# Patient Record
Sex: Male | Born: 1935 | Race: White | Hispanic: No | Smoking: Former smoker
Health system: Southern US, Community
[De-identification: ages and names within clinical notes are randomized; demographics above are authoritative.]

## PROBLEM LIST (undated history)

## (undated) DIAGNOSIS — Z9889 Other specified postprocedural states: Secondary | ICD-10-CM

## (undated) DIAGNOSIS — I10 Essential (primary) hypertension: Secondary | ICD-10-CM

## (undated) DIAGNOSIS — E079 Disorder of thyroid, unspecified: Secondary | ICD-10-CM

## (undated) DIAGNOSIS — R7303 Prediabetes: Secondary | ICD-10-CM

## (undated) DIAGNOSIS — R112 Nausea with vomiting, unspecified: Secondary | ICD-10-CM

## (undated) HISTORY — DX: Disorder of thyroid, unspecified: E07.9

## (undated) HISTORY — DX: Prediabetes: R73.03

## (undated) HISTORY — PX: HERNIA REPAIR: SHX51

---

## 2005-09-13 ENCOUNTER — Ambulatory Visit: Payer: Self-pay | Admitting: Pulmonary Disease

## 2006-06-12 ENCOUNTER — Ambulatory Visit: Payer: Self-pay | Admitting: Family Medicine

## 2006-10-14 ENCOUNTER — Ambulatory Visit: Payer: Self-pay | Admitting: Family Medicine

## 2006-10-18 ENCOUNTER — Ambulatory Visit: Payer: Self-pay | Admitting: Family Medicine

## 2006-10-24 ENCOUNTER — Ambulatory Visit: Payer: Self-pay | Admitting: Family Medicine

## 2006-11-12 ENCOUNTER — Ambulatory Visit: Payer: Self-pay | Admitting: Family Medicine

## 2014-09-06 DIAGNOSIS — E038 Other specified hypothyroidism: Secondary | ICD-10-CM | POA: Insufficient documentation

## 2015-02-22 ENCOUNTER — Telehealth: Payer: Self-pay | Admitting: Family Medicine

## 2015-02-22 NOTE — Telephone Encounter (Signed)
Pt given new pt appt with Dr.Miller 8/15 at 10:15. Pt is aware to arrive 30 minutes prior with a copy of his insurance card and valid photo ID. Pt will also come sign a records release to have records faxed over. Pt aware we don't do chronic pain management and he will bring current medication list with him.

## 2015-04-04 ENCOUNTER — Encounter (INDEPENDENT_AMBULATORY_CARE_PROVIDER_SITE_OTHER): Payer: Self-pay

## 2015-04-04 ENCOUNTER — Ambulatory Visit (INDEPENDENT_AMBULATORY_CARE_PROVIDER_SITE_OTHER): Payer: Medicare Other | Admitting: Family Medicine

## 2015-04-04 ENCOUNTER — Encounter: Payer: Self-pay | Admitting: Family Medicine

## 2015-04-04 VITALS — BP 148/80 | HR 60 | Temp 97.6°F | Ht 70.0 in | Wt 153.0 lb

## 2015-04-04 DIAGNOSIS — R5383 Other fatigue: Secondary | ICD-10-CM | POA: Diagnosis not present

## 2015-04-04 DIAGNOSIS — R5381 Other malaise: Secondary | ICD-10-CM | POA: Diagnosis not present

## 2015-04-04 DIAGNOSIS — R7303 Prediabetes: Secondary | ICD-10-CM | POA: Insufficient documentation

## 2015-04-04 NOTE — Progress Notes (Signed)
   Subjective:    Patient ID: Miguel Hill, male    DOB: 08-Oct-1932, 81 y.o.   MRN: [id number]  HPI 81 year old gentleman who is here to get established. He has been seeing a different doctor in town. I reviewed his notes from previous doctor. He has had some malaise and fatigue and been through lots of tests including thyroid testosterone levels Lyme disease as well as the usual metabolic chemistries. It looks like he was hypothyroid but patient is unwilling to take medicines because they "make him sleepy". Even vitamins are said to make him sleepy. We spent a fair amount of time discussing malaise and fatigue. He ask that he have some sort of tumor since vitamins and other prescription medicines make him sleepy. I explained that I think this would be unlikely causative symptom in this regard and discouraged lots more testing. Typically he has stopped medicines like thyroid hormone after just a few days and I think it might be important to take the medicine longer as his body adjusts. He also had an elevated PSA that was repeated after a course of anabiotic and by his history normalized. He is fairly well red but I think much of his information has been obtained through the inner net which is not very consistent.  Patient Active Problem List   Diagnosis Date Noted  . Prediabetes    No outpatient encounter prescriptions on file as of 04/04/2015.   No facility-administered encounter medications on file as of 04/04/2015.       Review of Systems  Constitutional: Positive for fatigue. Negative for unexpected weight change.  HENT: Negative.   Respiratory: Negative.   Cardiovascular: Negative.   Gastrointestinal: Negative.   Genitourinary: Negative.   Neurological: Negative.   Psychiatric/Behavioral: Negative.        Objective:   Physical Exam  Constitutional: He is oriented to person, place, and time. He appears well-developed and well-nourished.  HENT:  Head: Normocephalic.  Eyes:  Pupils are equal, round, and reactive to light.  Cardiovascular: Normal rate and regular rhythm.   Pulmonary/Chest: Effort normal and breath sounds normal.  Musculoskeletal: Normal range of motion.  Neurological: He is alert and oriented to person, place, and time.  Psychiatric: He has a normal mood and affect. His behavior is normal.     BP 148/80 mmHg  Pulse 60  Temp(Src) 97.6 F (36.4 C) (Oral)  Ht $R'5\' 10"'Vu$  (1.778 m)  Wt 153 lb (69.4 kg)  BMI 21.95 kg/m2      Assessment & Plan:  1. Malaise and fatigue I suspect many of his symptoms are related to age. I think it may be challenging to get him to take any medicines including thyroid supplement but we'll check some basic labs and go from there.  Wardell Honour MD - BMP8+EGFR - Thyroid Panel With TSH - CBC with Differential/Platelet

## 2015-04-05 ENCOUNTER — Other Ambulatory Visit: Payer: Self-pay | Admitting: *Deleted

## 2015-04-05 DIAGNOSIS — E039 Hypothyroidism, unspecified: Secondary | ICD-10-CM

## 2015-04-05 LAB — BMP8+EGFR
BUN/Creatinine Ratio: 19 (ref 10–22)
BUN: 17 mg/dL (ref 8–27)
CO2: 22 mmol/L (ref 18–29)
CREATININE: 0.89 mg/dL (ref 0.76–1.27)
Calcium: 9.6 mg/dL (ref 8.6–10.2)
Chloride: 103 mmol/L (ref 97–108)
GFR calc Af Amer: 94 mL/min/{1.73_m2} (ref >59)
GFR calc non Af Amer: 81 mL/min/{1.73_m2} (ref >59)
GLUCOSE: 95 mg/dL (ref 65–99)
Potassium: 4.5 mmol/L (ref 3.5–5.2)
Sodium: 140 mmol/L (ref 134–144)

## 2015-04-05 LAB — CBC WITH DIFFERENTIAL/PLATELET
BASOS ABS: 0 10*3/uL (ref 0.0–0.2)
Basos: 0 %
EOS (ABSOLUTE): 0.1 10*3/uL (ref 0.0–0.4)
Eos: 1 %
Hematocrit: 43.6 % (ref 37.5–51.0)
Hemoglobin: 14.8 g/dL (ref 12.6–17.7)
Immature Grans (Abs): 0 10*3/uL (ref 0.0–0.1)
Immature Granulocytes: 0 %
LYMPHS ABS: 1.7 10*3/uL (ref 0.7–3.1)
Lymphs: 29 %
MCH: 30.3 pg (ref 26.6–33.0)
MCHC: 33.9 g/dL (ref 31.5–35.7)
MCV: 89 fL (ref 79–97)
MONOS ABS: 0.6 10*3/uL (ref 0.1–0.9)
Monocytes: 10 %
Neutrophils Absolute: 3.4 10*3/uL (ref 1.4–7.0)
Neutrophils: 60 %
PLATELETS: 163 10*3/uL (ref 150–379)
RBC: 4.88 x10E6/uL (ref 4.14–5.80)
RDW: 13.4 % (ref 12.3–15.4)
WBC: 5.9 10*3/uL (ref 3.4–10.8)

## 2015-04-05 LAB — THYROID PANEL WITH TSH
Free Thyroxine Index: 2.1 (ref 1.2–4.9)
T3 Uptake Ratio: 26 % (ref 24–39)
T4 TOTAL: 8.1 ug/dL (ref 4.5–12.0)
TSH: 8.25 u[IU]/mL — AB (ref 0.450–4.500)

## 2015-04-28 ENCOUNTER — Telehealth: Payer: Self-pay | Admitting: Family Medicine

## 2015-04-28 DIAGNOSIS — G479 Sleep disorder, unspecified: Secondary | ICD-10-CM

## 2015-05-12 NOTE — Telephone Encounter (Signed)
Okay for sleep study

## 2015-05-13 NOTE — Telephone Encounter (Signed)
Left message for patient to call back regarding sleep study.  Patient will need to see a Neurologist and the neurologist will have to make referral.  Referral for neurology made.

## 2015-05-16 ENCOUNTER — Telehealth: Payer: Self-pay | Admitting: Family Medicine

## 2015-05-20 NOTE — Telephone Encounter (Signed)
Patient has not heard anything about his neurology appointment.  He prefers to go to [place] or [facility] .  Please check  On referral [phone].

## 2015-05-24 NOTE — Telephone Encounter (Signed)
Spoke with pt; We will try to find a company that does home sleep studies. He has had this done in the past

## 2015-05-30 ENCOUNTER — Telehealth: Payer: Self-pay | Admitting: Family Medicine

## 2015-05-30 NOTE — Telephone Encounter (Signed)
Pt aware that we are waiting on [employer] to call us about home study for CPAP

## 2015-06-01 ENCOUNTER — Telehealth: Payer: Self-pay | Admitting: Internal Medicine

## 2015-06-01 NOTE — Telephone Encounter (Signed)
Spoke with pt, requesting a home sleep test instead of a lab test d/t living far away from sleep lab.  I advised pt that CY does not return to office until early next week, and we would relay CY's recs to pt when he returns next week.  Pt expressed understanding.  CY please advise if you're ok with pt having a home sleep test instead of a lab sleep test.  Thanks!

## 2015-06-08 NOTE — Telephone Encounter (Signed)
Pt returned call. I informed him of recs and he refused to make ov with our office and stated that he would speak to the referring MD about ordering a sleep study. Pt voiced understanding and had no further questions. Nothing further needed, will sign off on message.

## 2015-06-08 NOTE — Telephone Encounter (Signed)
LMTCB

## 2015-06-08 NOTE — Telephone Encounter (Signed)
Pt checking on sched a HST, please cb at previous number listed

## 2015-06-08 NOTE — Telephone Encounter (Signed)
Spoke with pt, advised that we were still waiting to hear from CY if he's ok with a hst instead of a lab sleep test.    CY please advise.  Thanks!

## 2015-06-08 NOTE — Telephone Encounter (Signed)
We are not able to order sleep study on a patient we have never seen; pt can speak with referring MD to see if they will set up sleep study. Pt will need to be followed if CPAP/BiPAP will be used after study.   Pt can set up consult with a sleep MD here in the office or discuss with his referring MD-we understand he does not want to drive 10-31 miles to our office. Thanks.

## 2015-06-09 ENCOUNTER — Telehealth: Payer: Self-pay | Admitting: Family Medicine

## 2015-06-14 ENCOUNTER — Telehealth: Payer: Self-pay | Admitting: Family Medicine

## 2015-06-22 ENCOUNTER — Telehealth: Payer: Self-pay | Admitting: Family Medicine

## 2015-06-23 ENCOUNTER — Telehealth: Payer: Self-pay | Admitting: Family Medicine

## 2015-06-24 NOTE — Telephone Encounter (Signed)
Spoke with pt. Will call him back on Monday with more information

## 2015-06-28 NOTE — Telephone Encounter (Signed)
Pt aware of  In-home sleep study status

## 2015-07-01 NOTE — Telephone Encounter (Signed)
Pt aware of status referral

## 2015-08-17 ENCOUNTER — Telehealth: Payer: Self-pay | Admitting: Family Medicine

## 2015-08-17 NOTE — Telephone Encounter (Signed)
Miguel Hill is handling this right now

## 2015-08-17 NOTE — Telephone Encounter (Signed)
Please advise 

## 2015-09-21 ENCOUNTER — Telehealth: Payer: Self-pay | Admitting: Family Medicine

## 2015-09-22 ENCOUNTER — Encounter: Payer: Self-pay | Admitting: Family Medicine

## 2015-09-22 ENCOUNTER — Ambulatory Visit (INDEPENDENT_AMBULATORY_CARE_PROVIDER_SITE_OTHER): Payer: Medicare Other | Admitting: Family Medicine

## 2015-09-22 VITALS — BP 154/83 | HR 59 | Temp 96.7°F | Ht 70.0 in | Wt 154.0 lb

## 2015-09-22 DIAGNOSIS — G479 Sleep disorder, unspecified: Secondary | ICD-10-CM | POA: Diagnosis not present

## 2015-09-22 DIAGNOSIS — R5383 Other fatigue: Secondary | ICD-10-CM | POA: Diagnosis not present

## 2015-09-22 NOTE — Telephone Encounter (Signed)
Pt scheduled to have a face to face with Dr.Miller 09/22/15 at 10:30

## 2015-09-22 NOTE — Progress Notes (Signed)
   Subjective:    Patient ID: Miguel Hill, male    DOB: 1932-08-29, 82 y.o.   MRN: [id number]  HPI Patient here today to discuss possible sleep apnea and fatigue.  At his last visit patient remarked that all pills make him sleepy including thyroid nerves and anything. Due to some of his reading he has thought that he might have sleep apnea and that would possibly make sense that the pills are not making him sleepy as he had thought that he has daytime sleepiness because of possible apnea. He comes today to schedule a visit for home sleep study.      Patient Active Problem List   Diagnosis Date Noted  . Prediabetes    No outpatient encounter prescriptions on file as of 09/22/2015.   No facility-administered encounter medications on file as of 09/22/2015.      Review of Systems  Constitutional: Positive for fatigue.  HENT: Negative.   Eyes: Negative.   Respiratory: Negative.   Cardiovascular: Negative.   Gastrointestinal: Negative.   Endocrine: Negative.   Genitourinary: Negative.   Musculoskeletal: Negative.   Skin: Negative.   Allergic/Immunologic: Negative.   Neurological: Negative.   Hematological: Negative.   Psychiatric/Behavioral: Negative.        Objective:   Physical Exam  Constitutional: He is oriented to person, place, and time. He appears well-developed and well-nourished.  Cardiovascular: Normal rate, regular rhythm and normal heart sounds.   Pulmonary/Chest: Effort normal and breath sounds normal.  Neurological: He is alert and oriented to person, place, and time.  Psychiatric: He has a normal mood and affect. His behavior is normal.    BP 154/83 mmHg  Pulse 59  Temp(Src) 96.7 F (35.9 C) (Oral)  Ht 5\' 10"  (1.778 m)  Wt 154 lb (69.854 kg)  BMI 22.10 kg/m2       Assessment & Plan:   1. Fatigue due to sleep pattern disturbance T due to sleep apnea is working.. Will order a sleep study to document reality. Discussed possibility of wearing CPAP at  night and he is willing to do that if we can document the problem  Wardell Honour MD

## 2015-09-22 NOTE — Telephone Encounter (Signed)
x

## 2015-09-29 ENCOUNTER — Telehealth: Payer: Self-pay | Admitting: Family Medicine

## 2015-09-29 NOTE — Telephone Encounter (Signed)
Did we make this referral?  If he had the study, are results available

## 2015-09-29 NOTE — Telephone Encounter (Signed)
Please address

## 2015-09-30 ENCOUNTER — Encounter: Payer: Self-pay | Admitting: Family Medicine

## 2015-10-26 ENCOUNTER — Telehealth: Payer: Self-pay | Admitting: Family Medicine

## 2015-10-26 NOTE — Telephone Encounter (Signed)
I do not see results of sleep study in Epic. Maybe we could call for the results.

## 2015-10-26 NOTE — Telephone Encounter (Signed)
Patient says [facility] [phone]), has sent [facility] results of his tests.  We will look for a faxed report from them.  If unable to locate, will call for a result report.

## 2015-10-27 NOTE — Telephone Encounter (Signed)
Pt aware we do not file insurance for any home studies and he will need to contact the company that performed the study

## 2015-11-04 ENCOUNTER — Encounter: Payer: Self-pay | Admitting: Family Medicine

## 2015-11-04 ENCOUNTER — Ambulatory Visit (INDEPENDENT_AMBULATORY_CARE_PROVIDER_SITE_OTHER): Payer: Medicare Other

## 2015-11-04 ENCOUNTER — Ambulatory Visit (INDEPENDENT_AMBULATORY_CARE_PROVIDER_SITE_OTHER): Payer: Medicare Other | Admitting: Family Medicine

## 2015-11-04 VITALS — BP 185/89 | HR 57 | Temp 96.7°F | Ht 70.0 in | Wt 153.0 lb

## 2015-11-04 DIAGNOSIS — M25561 Pain in right knee: Secondary | ICD-10-CM

## 2015-11-04 MED ORDER — CLORAZEPATE DIPOTASSIUM 3.75 MG PO TABS: 3.7500 mg | ORAL_TABLET | Freq: Two times a day (BID) | ORAL | Status: DC | PRN

## 2015-11-04 MED ORDER — MELOXICAM 7.5 MG PO TABS: 7.5000 mg | ORAL_TABLET | Freq: Every day | ORAL | Status: DC

## 2015-11-04 NOTE — Progress Notes (Signed)
   Subjective:    Patient ID: Miguel Hill, male    DOB: [birth year], 82 y.o.   MRN: [id number]  HPI Patient here today for right knee pain that started about 2-3 weeks ago.There is no known injury. No sensation of giving way or locking. There are no unusual sounds coming from the knee with stair climbing or walking. There is been no swelling except maybe some swelling posteriorly.     Patient Active Problem List   Diagnosis Date Noted  . Fatigue due to sleep pattern disturbance 09/22/2015  . Prediabetes    No outpatient encounter prescriptions on file as of 11/04/2015.   No facility-administered encounter medications on file as of 11/04/2015.      Review of Systems  Constitutional: Negative.   HENT: Negative.   Eyes: Negative.   Respiratory: Negative.   Cardiovascular: Negative.   Gastrointestinal: Negative.   Endocrine: Negative.   Genitourinary: Negative.   Musculoskeletal: Positive for arthralgias (right knee pain).  Skin: Negative.   Allergic/Immunologic: Negative.   Neurological: Negative.   Hematological: Negative.   Psychiatric/Behavioral: Negative.        Objective:   Physical Exam  Constitutional: He appears well-developed and well-nourished.  Musculoskeletal:  Right knee: There is no effusion. There is no tenderness with manipulation of the patella. Joint lines are nontender and the knee is stable to stress testing and drawer sign is negative. There is some fullness posteriorly especially compared to left knee. X-ray shows some narrowing of the joint space medially on both knees   BP 185/89 mmHg  Pulse 57  Temp(Src) 96.7 F (35.9 C) (Oral)  Ht 5\' 10"  (1.778 m)  Wt 153 lb (69.4 kg)  BMI 21.95 kg/m2        Assessment & Plan:  1. Right knee pain I believe knee pain is coming from some osteoarthritis degenerative joint disease. There is at least a suggestion of Baker's cyst. Will try meloxicam 7.5 mg. Patient is also using some DMSO liniment. Wardell Honour MD - DG Knee 1-2 Views Right; Future

## 2015-11-14 ENCOUNTER — Telehealth: Payer: Self-pay | Admitting: Family Medicine

## 2015-11-14 NOTE — Telephone Encounter (Signed)
Patient given an appointment with Sabra Heck.

## 2015-11-17 ENCOUNTER — Ambulatory Visit (INDEPENDENT_AMBULATORY_CARE_PROVIDER_SITE_OTHER): Payer: Medicare Other | Admitting: Family Medicine

## 2015-11-17 ENCOUNTER — Encounter: Payer: Self-pay | Admitting: Family Medicine

## 2015-11-17 VITALS — BP 161/81 | HR 63 | Temp 96.8°F | Ht 70.0 in | Wt 158.6 lb

## 2015-11-17 DIAGNOSIS — G478 Other sleep disorders: Secondary | ICD-10-CM

## 2015-11-17 DIAGNOSIS — M25561 Pain in right knee: Secondary | ICD-10-CM | POA: Diagnosis not present

## 2015-11-17 DIAGNOSIS — G4730 Sleep apnea, unspecified: Secondary | ICD-10-CM

## 2015-11-17 NOTE — Progress Notes (Signed)
   Subjective:    Patient ID: Miguel Hill, male    DOB: 09-22-1932, 82 y.o.   MRN: [id number]  HPI patient returns today with persistent pain in his right knee. He could not take meloxicam as it made him sleepy, as does everything. He still uses DMSO. He actually seemed a little undecided about injection of his knee but eventually decided he would try it. We also spent time talking about his home sleep apnea study which showed basically that he does not have obstructive sleep apnea. He had some desaturation and wants to pursue overnight pulse oximetry to see if he would qualify for home oxygen. I'm not really sure that this would benefit him. His symptoms are tiredness in the daytime but is not because of apnea.  Patient Active Problem List   Diagnosis Date Noted  . Fatigue due to sleep pattern disturbance 09/22/2015  . Prediabetes    Outpatient Encounter Prescriptions as of 11/17/2015  Medication Sig  . [DISCONTINUED] clorazepate (TRANXENE-T) 3.75 MG tablet Take 1 tablet (3.75 mg total) by mouth 2 (two) times daily as needed for anxiety.  . [DISCONTINUED] meloxicam (MOBIC) 7.5 MG tablet Take 1 tablet (7.5 mg total) by mouth daily.   No facility-administered encounter medications on file as of 11/17/2015.      Review of Systems  Constitutional: Positive for fatigue.  Respiratory: Negative.   Cardiovascular: Negative.   Neurological: Negative.   Psychiatric/Behavioral: Negative.        Objective:   Physical Exam  Constitutional: He appears well-developed and well-nourished.  Cardiovascular: Normal rate and regular rhythm.   Musculoskeletal:  Right knee easily injected with Depo-Medrol and Marcaine.          Assessment & Plan:

## 2015-11-21 ENCOUNTER — Telehealth: Payer: Self-pay

## 2015-11-21 DIAGNOSIS — R0902 Hypoxemia: Secondary | ICD-10-CM

## 2015-11-21 NOTE — Telephone Encounter (Signed)
Patient calling about the Pulse oximetry that you were suppose to order on him   Dont see anything   Dr Sabra Heck

## 2015-11-22 NOTE — Telephone Encounter (Signed)
Had intended to order overnight pulse oximetry to see if he would benefit from nighttime oxygen

## 2015-12-05 ENCOUNTER — Telehealth: Payer: Self-pay | Admitting: Family Medicine

## 2015-12-05 NOTE — Telephone Encounter (Signed)
Please address

## 2015-12-06 NOTE — Telephone Encounter (Signed)
Pt aware  - via emial - we did hear from [facility] and she will contact pt

## 2015-12-06 NOTE — Telephone Encounter (Signed)
I do not know how to put this order in   Can you help?

## 2015-12-06 NOTE — Telephone Encounter (Signed)
Again, I thought we had ordered this overnight pulse oximetry; is tthere anybody who can do that?  thank you

## 2015-12-14 ENCOUNTER — Encounter: Payer: Self-pay | Admitting: Family Medicine

## 2015-12-21 ENCOUNTER — Telehealth: Payer: Self-pay | Admitting: Family Medicine

## 2015-12-21 NOTE — Telephone Encounter (Signed)
Test printed for provider to review and patient will be contacted afterwards with results.

## 2015-12-21 NOTE — Telephone Encounter (Signed)
Aware of oximetry being all right with no need for supplemental oxygen. He says he may purchase a C-pap machine to see if it helps him have more energy during the day. He plans to follow up with Dr. Sabra Heck to be evaluated for his poor sleep and lack of energy.

## 2015-12-21 NOTE — Telephone Encounter (Signed)
Patient's result shows one episode of decreased oxygen at 89% but does not qualify him for any oxygen, per Dr. Sabra Heck.

## 2015-12-23 NOTE — Telephone Encounter (Signed)
Patient aware , he does not qualify for oxygen.

## 2015-12-29 ENCOUNTER — Telehealth: Payer: Self-pay | Admitting: *Deleted

## 2015-12-29 NOTE — Telephone Encounter (Signed)
-----   Message from Wardell Honour, MD sent at 12/29/2015  7:56 AM EDT ----- Overnight pulse oximetry felt to demonstrate need for continuous oxygen

## 2016-01-19 ENCOUNTER — Other Ambulatory Visit: Payer: Self-pay | Admitting: Family Medicine

## 2016-01-20 NOTE — Telephone Encounter (Signed)
Spoke with patient, he went yesterday and got a new meter and strips to go along with it.

## 2016-02-24 ENCOUNTER — Ambulatory Visit (INDEPENDENT_AMBULATORY_CARE_PROVIDER_SITE_OTHER): Payer: Medicare Other | Admitting: Family Medicine

## 2016-02-24 ENCOUNTER — Encounter: Payer: Self-pay | Admitting: Family Medicine

## 2016-02-24 VITALS — BP 158/85 | HR 59 | Temp 97.1°F | Ht 70.0 in | Wt 152.0 lb

## 2016-02-24 DIAGNOSIS — R5383 Other fatigue: Secondary | ICD-10-CM

## 2016-02-24 DIAGNOSIS — Z0000 Encounter for general adult medical examination without abnormal findings: Secondary | ICD-10-CM

## 2016-02-24 NOTE — Patient Instructions (Signed)
Medicare Annual Wellness Visit  [place] and the medical providers at [facility] strive to bring you the best medical care.  In doing so we not only want to address your current medical conditions and concerns but also to detect new conditions early and prevent illness, disease and health-related problems.    Medicare offers a yearly Wellness Visit which allows our clinical staff to assess your need for preventative services including immunizations, lifestyle education, counseling to decrease risk of preventable diseases and screening for fall risk and other medical concerns.    This visit is provided free of charge (no copay) for all Medicare recipients. The clinical pharmacists at [facility] have begun to conduct these Wellness Visits which will also include a thorough review of all your medications.    As you primary medical provider recommend that you make an appointment for your Annual Wellness Visit if you have not done so already this year.  You may set up this appointment before you leave today or you may call back ([phone]) and schedule an appointment.  Please make sure when you call that you mention that you are scheduling your Annual Wellness Visit with the clinical pharmacist so that the appointment may be made for the proper length of time.     Continue current medications. Continue good therapeutic lifestyle changes which include good diet and exercise. Fall precautions discussed with patient. If an FOBT was given today- please return it to our front desk. If you are over 50 years old - you may need Prevnar 13 or the adult Pneumonia vaccine.  After your visit with us today you will receive a survey in the mail or online from [employer] regarding your care with us. Please take a moment to fill this out. Your feedback is very important to us as you can help us better understand your patient needs as well as improve  your experience and satisfaction. WE CARE ABOUT YOU!!!    

## 2016-02-24 NOTE — Progress Notes (Signed)
Subjective:    Patient ID: Miguel Hill, male    DOB: [birth year], 82 y.o.   MRN: [id number]  HPI Patient is here today for annual wellness exam and follow up of chronic medical problems. He is not currently taking any medications.  Patient is here for physical his main complaint is chronic and that is malaise and fatigue. He is a avid reader of the Internet and presents a list of tests and blood work he would like to have to rule out why he might be tired. He is hypothyroid but every peel even vitamins or supplements that he takes, he contends makes him sleepy. I have never encountered a situation like this. Basically he is on no medicines because he cannot take anything. Appetite is good. We had some sleep studies done to try to explain the tiredness that everything really checked out well there    Patient Active Problem List   Diagnosis Date Noted  . Fatigue due to sleep pattern disturbance 09/22/2015  . Prediabetes    No outpatient encounter prescriptions on file as of 02/24/2016.   No facility-administered encounter medications on file as of 02/24/2016.      Review of Systems  Constitutional: Positive for fatigue (decresed enery lelvel).  HENT: Negative.   Eyes: Negative.   Respiratory: Negative.   Cardiovascular: Negative.   Gastrointestinal: Negative.   Endocrine: Negative.   Genitourinary: Negative.   Musculoskeletal: Negative.   Skin: Negative.   Allergic/Immunologic: Negative.   Neurological: Negative.   Hematological: Negative.   Psychiatric/Behavioral: Negative.        Objective:   Physical Exam  Constitutional: He is oriented to person, place, and time. He appears well-developed and well-nourished.  HENT:  Head: Normocephalic.  Right Ear: External ear normal.  Left Ear: External ear normal.  Nose: Nose normal.  Mouth/Throat: Oropharynx is clear and moist.  Eyes: Conjunctivae and EOM are normal. Pupils are equal, round, and reactive to light.  Neck:  Normal range of motion. Neck supple.  Cardiovascular: Normal rate, regular rhythm, normal heart sounds and intact distal pulses.   Pulmonary/Chest: Effort normal and breath sounds normal.  Abdominal: Soft. Bowel sounds are normal.  Musculoskeletal: Normal range of motion.  Neurological: He is alert and oriented to person, place, and time.  Skin: Skin is warm and dry.  Psychiatric: He has a normal mood and affect. His behavior is normal. Judgment and thought content normal.   BP 158/85 mmHg  Pulse 59  Temp(Src) 97.1 F (36.2 C) (Oral)  Ht '5\' 10"'$  (1.778 m)  Wt 152 lb (68.947 kg)  BMI 21.81 kg/m2        Assessment & Plan:  1. Annual physical exam Exam is normal. I suspect his malaise and fatigue has a depressed basis but he is unwilling to take any antidepressants because they make him sleepy. - CMP14+EGFR - Lipid panel - Thyroid Panel With TSH - Cortisol-am, blood - Lyme Ab/Western Blot Reflex - Testosterone,Free and Total - C-reactive protein - Vitamin B12 - VITAMIN D 25 Hydroxy (Vit-D Deficiency, Fractures) - Magnesium  2. Other fatigue Patient requests many of the test and I'm willing to do what is practical to try and help but I'm not optimistic that any of the tests will be abnormal except for the TSH. - Thyroid Panel With TSH - Cortisol-am, blood - Lyme Ab/Western Blot Reflex - Testosterone,Free and Total - C-reactive protein - Vitamin B12 - VITAMIN D 25 Hydroxy (Vit-D Deficiency, Fractures) - Magnesium  Lillette Boxer  Sabra Heck MD

## 2016-02-27 LAB — CMP14+EGFR
A/G RATIO: 1.6 (ref 1.2–2.2)
ALBUMIN: 4.1 g/dL (ref 3.5–4.7)
ALT: 15 IU/L (ref 0–44)
AST: 21 IU/L (ref 0–40)
Alkaline Phosphatase: 72 IU/L (ref 39–117)
BUN/Creatinine Ratio: 17 (ref 10–24)
BUN: 14 mg/dL (ref 8–27)
Bilirubin Total: 0.3 mg/dL (ref 0.0–1.2)
CALCIUM: 9.1 mg/dL (ref 8.6–10.2)
CO2: 24 mmol/L (ref 18–29)
CREATININE: 0.83 mg/dL (ref 0.76–1.27)
Chloride: 104 mmol/L (ref 96–106)
GFR, EST AFRICAN AMERICAN: 96 mL/min/{1.73_m2} (ref >59)
GFR, EST NON AFRICAN AMERICAN: 83 mL/min/{1.73_m2} (ref >59)
GLOBULIN, TOTAL: 2.5 g/dL (ref 1.5–4.5)
Glucose: 97 mg/dL (ref 65–99)
POTASSIUM: 3.9 mmol/L (ref 3.5–5.2)
SODIUM: 143 mmol/L (ref 134–144)
TOTAL PROTEIN: 6.6 g/dL (ref 6.0–8.5)

## 2016-02-27 LAB — LIPID PANEL
CHOL/HDL RATIO: 3.1 ratio (ref 0.0–5.0)
Cholesterol, Total: 179 mg/dL (ref 100–199)
HDL: 58 mg/dL (ref >39)
LDL CALC: 105 mg/dL — AB (ref 0–99)
TRIGLYCERIDES: 80 mg/dL (ref 0–149)
VLDL Cholesterol Cal: 16 mg/dL (ref 5–40)

## 2016-02-27 LAB — TESTOSTERONE,FREE AND TOTAL
Testosterone, Free: 4.2 pg/mL — ABNORMAL LOW (ref 6.6–18.1)
Testosterone: 537 ng/dL (ref 348–1197)

## 2016-02-27 LAB — LYME AB/WESTERN BLOT REFLEX
LYME DISEASE AB, QUANT, IGM: <0.8 index (ref 0.00–0.79)
Lyme IgG/IgM Ab: <0.91 {ISR} (ref 0.00–0.90)

## 2016-02-27 LAB — THYROID PANEL WITH TSH
Free Thyroxine Index: 1.8 (ref 1.2–4.9)
T3 Uptake Ratio: 25 % (ref 24–39)
T4 TOTAL: 7 ug/dL (ref 4.5–12.0)
TSH: 2.65 u[IU]/mL (ref 0.450–4.500)

## 2016-02-27 LAB — MAGNESIUM: Magnesium: 2.1 mg/dL (ref 1.6–2.3)

## 2016-02-27 LAB — C-REACTIVE PROTEIN: CRP: 1 mg/L (ref 0.0–4.9)

## 2016-02-27 LAB — VITAMIN D 25 HYDROXY (VIT D DEFICIENCY, FRACTURES): VIT D 25 HYDROXY: 24.2 ng/mL — AB (ref 30.0–100.0)

## 2016-02-27 LAB — CORTISOL-AM, BLOOD: CORTISOL - AM: 10.4 ug/dL (ref 6.2–19.4)

## 2016-02-27 LAB — VITAMIN B12: VITAMIN B 12: 329 pg/mL (ref 211–946)

## 2016-03-05 ENCOUNTER — Telehealth: Payer: Self-pay | Admitting: *Deleted

## 2016-03-05 NOTE — Telephone Encounter (Signed)
Pt said he never got labs that were mailed to him?

## 2016-03-05 NOTE — Telephone Encounter (Signed)
Labs have been re sent

## 2016-03-07 ENCOUNTER — Telehealth: Payer: Self-pay | Admitting: Family Medicine

## 2016-03-12 ENCOUNTER — Other Ambulatory Visit: Payer: Self-pay | Admitting: *Deleted

## 2016-03-12 ENCOUNTER — Other Ambulatory Visit: Payer: Medicare Other

## 2016-03-12 DIAGNOSIS — Z0000 Encounter for general adult medical examination without abnormal findings: Secondary | ICD-10-CM

## 2016-03-13 LAB — FECAL OCCULT BLOOD, IMMUNOCHEMICAL: Fecal Occult Bld: NEGATIVE

## 2016-03-13 NOTE — Telephone Encounter (Signed)
PT labs were mailed by Nigel Berthold according to note in labs.

## 2016-03-16 ENCOUNTER — Telehealth: Payer: Self-pay | Admitting: Family Medicine

## 2016-03-16 DIAGNOSIS — R5383 Other fatigue: Secondary | ICD-10-CM

## 2016-03-16 NOTE — Telephone Encounter (Signed)
Patient is requesting a cbc and ferritin check. Please advise. Patient aware that you will be out of the office until Thursday Aug 3.

## 2016-03-22 ENCOUNTER — Other Ambulatory Visit: Payer: Medicare Other

## 2016-03-22 DIAGNOSIS — E039 Hypothyroidism, unspecified: Secondary | ICD-10-CM

## 2016-03-22 DIAGNOSIS — R5383 Other fatigue: Secondary | ICD-10-CM

## 2016-03-22 NOTE — Telephone Encounter (Signed)
Patient aware that orders have been placed.

## 2016-03-22 NOTE — Telephone Encounter (Signed)
I have no problem doing CBC but DHEA is not necessary given other labs already completed

## 2016-03-24 LAB — CBC WITH DIFFERENTIAL/PLATELET

## 2016-03-24 LAB — THYROID PANEL WITH TSH
Free Thyroxine Index: 1.7 (ref 1.2–4.9)
T3 Uptake Ratio: 24 % (ref 24–39)
T4 TOTAL: 6.9 ug/dL (ref 4.5–12.0)
TSH: 2.95 u[IU]/mL (ref 0.450–4.500)

## 2016-03-24 LAB — FERRITIN: FERRITIN: 42 ng/mL (ref 30–400)

## 2016-03-24 LAB — DHEA: Dehydroepiandrosterone: 55 ng/dL (ref 31–701)

## 2016-03-30 ENCOUNTER — Telehealth: Payer: Self-pay | Admitting: Family Medicine

## 2016-03-30 DIAGNOSIS — R5383 Other fatigue: Secondary | ICD-10-CM

## 2016-03-30 NOTE — Telephone Encounter (Signed)
Pt notified of results Verbalizes understanding Cbc not done, cancelled by [facility] Order entered in Epic  Pt will come back in for lab

## 2016-04-06 ENCOUNTER — Other Ambulatory Visit: Payer: Medicare Other

## 2016-04-06 DIAGNOSIS — R5383 Other fatigue: Secondary | ICD-10-CM

## 2016-04-06 LAB — CBC WITH DIFFERENTIAL/PLATELET
BASOS ABS: 0 10*3/uL (ref 0.0–0.2)
Basos: 0 %
EOS (ABSOLUTE): 0.1 10*3/uL (ref 0.0–0.4)
EOS: 2 %
HEMATOCRIT: 42.8 % (ref 37.5–51.0)
HEMOGLOBIN: 14.9 g/dL (ref 12.6–17.7)
IMMATURE GRANS (ABS): 0 10*3/uL (ref 0.0–0.1)
IMMATURE GRANULOCYTES: 0 %
LYMPHS: 30 %
Lymphocytes Absolute: 1.6 10*3/uL (ref 0.7–3.1)
MCH: 30.8 pg (ref 26.6–33.0)
MCHC: 34.8 g/dL (ref 31.5–35.7)
MCV: 88 fL (ref 79–97)
MONOCYTES: 11 %
Monocytes Absolute: 0.6 10*3/uL (ref 0.1–0.9)
NEUTROS PCT: 57 %
Neutrophils Absolute: 3 10*3/uL (ref 1.4–7.0)
Platelets: 152 10*3/uL (ref 150–379)
RBC: 4.84 x10E6/uL (ref 4.14–5.80)
RDW: 12.8 % (ref 12.3–15.4)
WBC: 5.2 10*3/uL (ref 3.4–10.8)

## 2016-09-14 ENCOUNTER — Encounter: Payer: Self-pay | Admitting: Family

## 2016-09-14 ENCOUNTER — Ambulatory Visit (INDEPENDENT_AMBULATORY_CARE_PROVIDER_SITE_OTHER): Payer: Medicare Other | Admitting: Family

## 2016-09-14 VITALS — BP 158/80 | HR 57 | Temp 96.8°F | Ht 70.0 in | Wt 153.2 lb

## 2016-09-14 DIAGNOSIS — J209 Acute bronchitis, unspecified: Secondary | ICD-10-CM | POA: Diagnosis not present

## 2016-09-14 MED ORDER — DOXYCYCLINE HYCLATE 100 MG PO TABS: 100.0000 mg | ORAL_TABLET | Freq: Two times a day (BID) | ORAL | 0 refills | Status: DC

## 2016-09-14 MED ORDER — BENZONATATE 200 MG PO CAPS: 200.0000 mg | ORAL_CAPSULE | Freq: Three times a day (TID) | ORAL | 1 refills | Status: DC | PRN

## 2016-09-14 NOTE — Progress Notes (Signed)
Subjective:    Patient ID: Lamontez Kleist, male    DOB: Jul 14, 1933, 83 y.o.   MRN: [id number]  Cough  This is a new problem. The current episode started 1 to 4 weeks ago. The problem has been waxing and waning. The problem occurs every few minutes. The cough is productive of sputum. Associated symptoms include nasal congestion, postnasal drip, rhinorrhea and wheezing. Pertinent negatives include no chills, ear congestion, ear pain, fever, headaches, myalgias, sore throat or shortness of breath. The symptoms are aggravated by lying down. He has tried rest and OTC cough suppressant for the symptoms. The treatment provided mild relief. There is no history of asthma or COPD.      Review of Systems  Constitutional: Negative for chills and fever.  HENT: Positive for postnasal drip and rhinorrhea. Negative for ear pain and sore throat.   Respiratory: Positive for cough and wheezing. Negative for shortness of breath.   Musculoskeletal: Negative for myalgias.  Neurological: Negative for headaches.  All other systems reviewed and are negative.      Objective:   Physical Exam  Constitutional: He is oriented to person, place, and time. He appears well-developed and well-nourished. No distress.  HENT:  Head: Normocephalic.  Right Ear: External ear normal.  Left Ear: External ear normal.  Nose: Rhinorrhea and nose lacerations present.  Mouth/Throat: Posterior oropharyngeal erythema present.  Eyes: Pupils are equal, round, and reactive to light. Right eye exhibits no discharge. Left eye exhibits no discharge.  Neck: Normal range of motion. Neck supple. No thyromegaly present.  Cardiovascular: Normal rate, regular rhythm, normal heart sounds and intact distal pulses.   No murmur heard. Pulmonary/Chest: Effort normal. No respiratory distress. He has decreased breath sounds in the right middle field and the left middle field. He has no wheezes.  Intermittent nonproductive coarse cough     Abdominal: Soft. Bowel sounds are normal. He exhibits no distension. There is no tenderness.  Musculoskeletal: Normal range of motion. He exhibits no edema or tenderness.  Neurological: He is alert and oriented to person, place, and time.  Skin: Skin is warm and dry. No rash noted. No erythema.  Psychiatric: He has a normal mood and affect. His behavior is normal. Judgment and thought content normal.  Vitals reviewed.        BP (!) 158/80   Pulse (!) 57   Temp (!) 96.8 F (36 C) (Oral)   Ht 5\' 10"  (1.778 m)   Wt 153 lb 3.2 oz (69.5 kg)   BMI 21.98 kg/m   Assessment & Plan:  1. Acute bronchitis, unspecified organism - Take meds as prescribed - Use a cool mist humidifier  -Use saline nose sprays frequently -Saline irrigations of the nose can be very helpful if done frequently.  * 4X daily for 1 week*  * Use of a nettie pot can be helpful with this. Follow directions with this* -Force fluids -For any cough or congestion  Use plain Mucinex- regular strength or max strength is fine   * Children- consult with Pharmacist for dosing -For fever or aces or pains- take tylenol or ibuprofen appropriate for age and weight.  * for fevers greater than 101 orally you may alternate ibuprofen and tylenol every  3 hours. -Throat lozenges if help - doxycycline (VIBRA-TABS) 100 MG tablet; Take 1 tablet (100 mg total) by mouth 2 (two) times daily.  Dispense: 20 tablet; Refill: 0 - benzonatate (TESSALON) 200 MG capsule; Take 1 capsule (200 mg total) by mouth 3 (  three) times daily as needed.  Dispense: 30 capsule; Refill: [place], FNP

## 2016-09-14 NOTE — Patient Instructions (Signed)

## 2016-09-25 ENCOUNTER — Ambulatory Visit (INDEPENDENT_AMBULATORY_CARE_PROVIDER_SITE_OTHER): Payer: Medicare Other | Admitting: Family Medicine

## 2016-09-25 ENCOUNTER — Encounter: Payer: Self-pay | Admitting: Family Medicine

## 2016-09-25 VITALS — BP 168/76 | HR 62 | Temp 96.8°F | Ht 70.0 in | Wt 152.0 lb

## 2016-09-25 DIAGNOSIS — D485 Neoplasm of uncertain behavior of skin: Secondary | ICD-10-CM | POA: Diagnosis not present

## 2016-09-25 DIAGNOSIS — R030 Elevated blood-pressure reading, without diagnosis of hypertension: Secondary | ICD-10-CM | POA: Diagnosis not present

## 2016-09-25 DIAGNOSIS — C4491 Basal cell carcinoma of skin, unspecified: Secondary | ICD-10-CM | POA: Insufficient documentation

## 2016-09-25 NOTE — Patient Instructions (Signed)
Great to meet you!  Keep the area covered with a thin layer of vaseline and a bandaid until it is healed up, Come back in 2 weeks to get the wound re-checked.   You should have the area checked every 6 months after this based on what it turns out to be.

## 2016-09-25 NOTE — Addendum Note (Signed)
Addended by: Nigel Berthold C on: 09/25/2016 11:42 AM   Modules accepted: Orders

## 2016-09-25 NOTE — Progress Notes (Signed)
   HPI  Patient presents today here with a skin lesion.  Patient explains that it has been present for about 6 months. Is draining intermittently. It is difficult to tell if it is getting worse. It is irritated and continues to drain off and on.  Blood pressure Often 150/80 at home Does not want to take medications. No chest pain, dyspnea, palpitations, leg edema.  PMH: Smoking status noted ROS: Per HPI  Objective: BP (!) 168/76   Pulse 62   Temp (!) 96.8 F (36 C) (Oral)   Ht 5\' 10"  (1.778 m)   Wt 152 lb (68.9 kg)   BMI 21.81 kg/m  Gen: NAD, alert, cooperative with exam HEENT: NCAT CV: RRR, good S1/S2, no murmur Resp: CTABL, no wheezes, non-labored Ext: No edema, warm Neuro: Alert and oriented, No gross deficits  Skin Raised flat palpable lesion measuring 8 mm x 9 mm roughly circular behind the left mandible just overlying the SCM.   Shave biopsy: Informed consent signed and placed in the chart Area was prepped with Betadine 2 and allowed to dry, using 3-4 mL of 2% Xylocaine with epinephrine the area was anesthetized. Using a derma blade the lesion was removed and the base of it was cauterized using a Hyfrecator. Bleeding was easily controlled, the area was dressed using a small amount of U Pearson and a sterile bandage.  Patient tolerated procedure easily with no side effects.   Assessment and plan:  # Elevated blood pressure without diagnosis of hypertension Recommended considering blood pressure medications, however given age [age] is not unreasonable as a goal.   # Neoplasm of uncertain behavior skin Lesion removed as above Evaluated for the first time today Pathology sent Recommended every 6 month follow-up for that area, pending pathology Routine wound care discussed     Miguel Apple, MD [facility] 09/25/2016, 11:02 AM

## 2016-10-02 ENCOUNTER — Telehealth: Payer: Self-pay | Admitting: Family Medicine

## 2016-10-02 NOTE — Telephone Encounter (Signed)
Aware, results are not back.

## 2016-10-03 LAB — PATHOLOGY

## 2016-10-08 ENCOUNTER — Ambulatory Visit (INDEPENDENT_AMBULATORY_CARE_PROVIDER_SITE_OTHER): Payer: Medicare Other | Admitting: Family Medicine

## 2016-10-08 ENCOUNTER — Encounter: Payer: Self-pay | Admitting: Family Medicine

## 2016-10-08 VITALS — BP 173/90 | HR 61 | Temp 97.1°F | Ht 70.0 in | Wt 153.4 lb

## 2016-10-08 DIAGNOSIS — I10 Essential (primary) hypertension: Secondary | ICD-10-CM | POA: Diagnosis not present

## 2016-10-08 DIAGNOSIS — C44310 Basal cell carcinoma of skin of unspecified parts of face: Secondary | ICD-10-CM | POA: Diagnosis not present

## 2016-10-08 DIAGNOSIS — R400 Somnolence: Secondary | ICD-10-CM

## 2016-10-08 NOTE — Progress Notes (Signed)
   HPI  Patient presents today here with daytime sleepiness and hypertension for recheck of skin lesion.  Skin lesion Was shave biopsied with treatment of the Hyfrecator last visit, returned basal cell carcinoma with involved margins. Area has healed well, patient has no complaints.  Hypertension New diagnosis, patient does not want to take medications No chest pain, headaches, dizziness, leg edema, palpitations.  Daytime sleepiness Patient with persistent issues with daytime sleepiness, he states this is worse after eating fresh vegetables like onions or tomatoes.   PMH: Smoking status noted ROS: Per HPI  Objective: BP (!) 173/90   Pulse 61   Temp 97.1 F (36.2 C) (Oral)   Ht 5\' 10"  (1.778 m)   Wt 153 lb 6.4 oz (69.6 kg)   BMI 22.01 kg/m  Gen: NAD, alert, cooperative with exam HEENT: NCAT CV: RRR, good S1/S2, no murmur Resp: CTABL, no wheezes, non-labored Ext: No edema, warm Neuro: Alert and oriented, No gross deficits  SKin:  Pink area consistent with healing lesion on the left upper neck/left face just below the left mandible.   Assessment and plan:  # Basal cell carcinoma of the skin of the face Discussed with patient, recommended watchful waiting and recheck at least every 6 months of that area in particular Recommended complete excision if the area recurs It was biopsied and then treated with a Hyfrecator, hopefully we destroyed all the cancer cells Very low risk of malignancy with basal cell of the skin  # Daytime sleepiness Unclear etiology Patient seems to very interested in seeing a natriopath and natural medicines.  Continue to monitor  # HTN New Dx Offered medications, he declines, would start with HCTZ He will consider    Laroy Apple, MD [facility] 10/08/2016, 11:53 AM

## 2016-10-09 ENCOUNTER — Telehealth: Payer: Self-pay | Admitting: Family Medicine

## 2016-10-09 NOTE — Telephone Encounter (Signed)
Patient given contact information.

## 2016-10-09 NOTE — Telephone Encounter (Signed)
He may be interested in [facility] in [place], [phone]  Urology in [place] through [facility], I believe they coordinate appts from [place]- [phone] .   Laroy Apple, MD [facility] 10/09/2016, 12:48 PM

## 2016-10-10 ENCOUNTER — Telehealth: Payer: Self-pay | Admitting: Family Medicine

## 2016-10-10 DIAGNOSIS — M4800 Spinal stenosis, site unspecified: Secondary | ICD-10-CM | POA: Insufficient documentation

## 2016-10-10 NOTE — Telephone Encounter (Signed)
Spoke with pt - he wants a NEUROSURGEON  In the past he seen a neurosurgeon in [place] and now he prefers someone in [place], [place] if possible  History of spinal narrowing and has old MRIs

## 2016-10-10 NOTE — Telephone Encounter (Signed)
Pt has a hx of spinal stenosis dating back 15 years; He was wanting a recheck MRI due to rt side weakness in legs. No recent MRI; Scheduled pt for 10/12/16 at 2:55 for assessment and documentation.

## 2016-10-10 NOTE — Telephone Encounter (Signed)
Dr. Carloyn Manner is who he is looking for probably.   I am ok with referral for spinal stenosis but Dr. Carloyn Manner will require the MRI so pt will need to drop it off or have a report sent to [place] so we can refer him.   Laroy Apple, MD [facility] 10/10/2016, 12:29 PM

## 2016-10-10 NOTE — Telephone Encounter (Signed)
[facility] to x-ray 

## 2016-10-12 ENCOUNTER — Encounter: Payer: Self-pay | Admitting: Family Medicine

## 2016-10-12 ENCOUNTER — Ambulatory Visit (INDEPENDENT_AMBULATORY_CARE_PROVIDER_SITE_OTHER): Payer: Medicare Other | Admitting: Family Medicine

## 2016-10-12 ENCOUNTER — Ambulatory Visit (INDEPENDENT_AMBULATORY_CARE_PROVIDER_SITE_OTHER): Payer: Medicare Other

## 2016-10-12 VITALS — BP 167/81 | HR 56 | Temp 96.3°F | Ht 70.0 in | Wt 153.2 lb

## 2016-10-12 DIAGNOSIS — M545 Low back pain, unspecified: Secondary | ICD-10-CM

## 2016-10-12 DIAGNOSIS — G8929 Other chronic pain: Secondary | ICD-10-CM

## 2016-10-12 NOTE — Progress Notes (Signed)
   HPI  Patient presents today here with low back pain.  Patient states that the low back pain is mild to moderate, however more worrisome he has bilateral leg weakness. He states that he's had back pain similar to this with weakness similar to this since an injury in 2008. He has an MRI which shows, per his report, I will stenosis. He really wants a repeat MRI.  He is a 83 year old girlfriend who states he "walks like an old man". He however feels like he gets around pretty well.  He denies any recent injury. No reported bowel or bladder dysfunction, saddle anesthesia.   Patient does not want to start blood pressure medication  PMH: Smoking status noted ROS: Per HPI  Objective: BP (!) 167/81   Pulse (!) 56   Temp (!) 96.3 F (35.7 C) (Oral)   Ht 5\' 10"  (1.778 m)   Wt 153 lb 3.2 oz (69.5 kg)   BMI 21.98 kg/m  Gen: NAD, alert, cooperative with exam HEENT: NCAT CV: RRR, good S1/S2, no murmur Resp: CTABL, no wheezes, non-labored Ext: No edema, warm Neuro: Alert and oriented, strength 5/5 and sensation intact in bilateral lower extremities, normal by careful gait Muscular skeletal: No tenderness to palpation of the bilateral paraspinal muscles or midline lumbar spine  Assessment and plan:  # Chronic low back pain without sciatica Subjective leg weakness, exam is reassuring. Plain film today pending Recommended physical therapy which was ordered Follow-up in 6 weeks, if symptoms not improved would recommend repeat MRI with subjective weakness. From orthopedic surgery, the patient would like to attempt physical therapy first. For his pain is mild, however the most concerning symptom is the "weakness and fatigue of the legs".   Plain film: Mild disc space narrowing of L5/S1 and L2/L3, no acute fractures, radiology read pending.   Orders Placed This Encounter  Procedures  . DG Lumbar Spine Complete    Standing Status:   Future    Number of Occurrences:   1    Standing  Expiration Date:   12/10/2017    Order Specific Question:   Reason for Exam (SYMPTOM  OR DIAGNOSIS REQUIRED)    Answer:   leg weakness, back pain, Hx of spinal Stenosis    Order Specific Question:   Preferred imaging location?    Answer:   Internal  . Ambulatory referral to Physical Therapy    Referral Priority:   Routine    Referral Type:   Physical Medicine    Referral Reason:   Specialty Services Required    Requested Specialty:   Physical Therapy    Number of Visits Requested:   1    No orders of the defined types were placed in this encounter.   Laroy Apple, MD [facility] 10/12/2016, 5:03 PM

## 2017-01-29 ENCOUNTER — Telehealth: Payer: Self-pay | Admitting: Family Medicine

## 2017-01-29 NOTE — Telephone Encounter (Signed)
Informed pt that providers do Rx testosterone based of course on lab results.  He is going to make his physical appt with Wendi Snipes in July after the date he had it done last year with Dr. Sabra Heck

## 2017-03-12 ENCOUNTER — Ambulatory Visit (INDEPENDENT_AMBULATORY_CARE_PROVIDER_SITE_OTHER): Payer: Medicare Other | Admitting: Family Medicine

## 2017-03-12 ENCOUNTER — Telehealth: Payer: Self-pay | Admitting: Family Medicine

## 2017-03-12 ENCOUNTER — Encounter: Payer: Self-pay | Admitting: Family Medicine

## 2017-03-12 VITALS — BP 162/79 | HR 65 | Temp 97.0°F | Ht 70.0 in | Wt 149.6 lb

## 2017-03-12 DIAGNOSIS — I10 Essential (primary) hypertension: Secondary | ICD-10-CM | POA: Diagnosis not present

## 2017-03-12 DIAGNOSIS — R5383 Other fatigue: Secondary | ICD-10-CM

## 2017-03-12 DIAGNOSIS — N529 Male erectile dysfunction, unspecified: Secondary | ICD-10-CM | POA: Diagnosis not present

## 2017-03-12 DIAGNOSIS — R7303 Prediabetes: Secondary | ICD-10-CM

## 2017-03-12 MED ORDER — SILDENAFIL CITRATE 20 MG PO TABS: ORAL_TABLET | ORAL | 2 refills | Status: DC

## 2017-03-12 NOTE — Telephone Encounter (Signed)
Requesting that I add DHEA, homocystine, and CRP to his labs.  Do not see the utility of these labs, however CRP is not unreasonable. This will be added   Laroy Apple, MD [facility] 03/12/2017, 4:56 PM

## 2017-03-12 NOTE — Addendum Note (Signed)
Addended by: Timmothy Euler on: 03/12/2017 04:57 PM   Modules accepted: Orders

## 2017-03-12 NOTE — Progress Notes (Signed)
   HPI  Patient presents today here to discuss testosterone replacement, he complains of low energy.  Patient has normal sex drive, he does have some problems with erections. He's been reading about about testosterone deficiency and replacement and has many questions today.  Patient states that he's had extremely low energy for quite some time. He continues to state that after he eats fresh vegetables including cucumbers, tomatoes etc. he has unusual somnolence. He states this started about a year ago.  Patient has continued difficulty with erectile dysfunction, he has not tried medications.  He thinks that perhaps if he gets his testosterone up to 600-700 that he may have more energy and less problems with erections.  He has high blood pressure, he does not want to take medications which we have discussed at length previously. He is still not open to the idea of taking blood pressure medication  PMH: Smoking status noted ROS: Per HPI  Objective: BP (!) 162/79   Pulse 65   Temp (!) 97 F (36.1 C) (Oral)   Ht 5' 10" (1.778 m)   Wt 149 lb 9.6 oz (67.9 kg)   BMI 21.47 kg/m  Gen: NAD, alert, cooperative with exam HEENT: NCAT Neck: No thyromegaly CV: RRR, good S1/S2, no murmur Resp: CTABL, no wheezes, non-labored Ext: No edema, warm Neuro: Alert and oriented, No gross deficits  Assessment and plan:  # Low energy Patient is very concerned about his testosterone, we had a very clear conversation about testosterone replacement only in the presence of hypogonadism, and also how unclear it is to establish hypogonadism in an 83 year old. I declined checking estradiol levels. Testosterone, fasting, between 8 and 10 AM ordered  # Prediabetes Could be a source of low energy if it has worsened A1c Patient reports fasting blood sugar of around 95-105  # Hypertension Patient does not like taking medications, we have discussed risks and benefits and he continues to not want to take  medications. I have offered these again today   Erectile dysfunction Trial of 20 mg sildenafil    Orders Placed This Encounter  Procedures  . CMP14+EGFR    Standing Status:   Future    Standing Expiration Date:   03/12/2018  . CBC with Differential/Platelet    Standing Status:   Future    Standing Expiration Date:   03/12/2018  . Lipid panel    Standing Status:   Future    Standing Expiration Date:   03/12/2018  . Testosterone    Standing Status:   Future    Standing Expiration Date:   03/12/2018  . Bayer DCA Hb A1c Waived    Standing Status:   Future    Standing Expiration Date:   03/12/2018    Meds ordered this encounter  Medications  . sildenafil (REVATIO) 20 MG tablet    Sig: Take 2 to 5 pills once daily as needed for erectile dysfunction    Dispense:  20 tablet    Refill:  [place], MD [facility] 03/12/2017, 11:44 AM

## 2017-03-12 NOTE — Patient Instructions (Signed)
Great to see you!  I recommend you take blood pressure medicines, I would be glad to prescribe these when and if you are ready  We will send a letter or call with lab results

## 2017-03-13 ENCOUNTER — Telehealth: Payer: Self-pay | Admitting: Family Medicine

## 2017-03-13 ENCOUNTER — Other Ambulatory Visit: Payer: Medicare Other

## 2017-03-13 DIAGNOSIS — R7989 Other specified abnormal findings of blood chemistry: Secondary | ICD-10-CM

## 2017-03-13 DIAGNOSIS — R5383 Other fatigue: Secondary | ICD-10-CM

## 2017-03-13 DIAGNOSIS — R7303 Prediabetes: Secondary | ICD-10-CM

## 2017-03-13 LAB — BAYER DCA HB A1C WAIVED: HB A1C (BAYER DCA - WAIVED): 6 % (ref <7.0)

## 2017-03-13 NOTE — Telephone Encounter (Signed)
Called and discussed, proceed with CRP but not Homocysteine or DHEA.   Laroy Apple, MD [facility] 03/13/2017, 2:49 PM

## 2017-03-13 NOTE — Telephone Encounter (Signed)
lmtcb

## 2017-03-13 NOTE — Telephone Encounter (Signed)
Spoke with patient and advised him that a CRP was added but you didn't see the need for the other two tests. Patient states that he really wants these tests added and wants to know why you don't think they are necessary. He was very polite but very adamant that he would like these tests done. Please advise and route to pool A

## 2017-03-14 ENCOUNTER — Encounter: Payer: Self-pay | Admitting: Family Medicine

## 2017-03-14 LAB — LIPID PANEL
Chol/HDL Ratio: 3.2 ratio (ref 0.0–5.0)
Cholesterol, Total: 193 mg/dL (ref 100–199)
HDL: 60 mg/dL (ref >39)
LDL Calculated: 117 mg/dL — ABNORMAL HIGH (ref 0–99)
TRIGLYCERIDES: 82 mg/dL (ref 0–149)
VLDL CHOLESTEROL CAL: 16 mg/dL (ref 5–40)

## 2017-03-14 LAB — CMP14+EGFR
ALK PHOS: 72 IU/L (ref 39–117)
ALT: 9 IU/L (ref 0–44)
AST: 20 IU/L (ref 0–40)
Albumin/Globulin Ratio: 1.9 (ref 1.2–2.2)
Albumin: 4.2 g/dL (ref 3.5–4.7)
BUN/Creatinine Ratio: 20 (ref 10–24)
BUN: 17 mg/dL (ref 8–27)
Bilirubin Total: 0.4 mg/dL (ref 0.0–1.2)
CALCIUM: 9.3 mg/dL (ref 8.6–10.2)
CO2: 23 mmol/L (ref 20–29)
CREATININE: 0.87 mg/dL (ref 0.76–1.27)
Chloride: 105 mmol/L (ref 96–106)
GFR calc Af Amer: 94 mL/min/{1.73_m2} (ref >59)
GFR, EST NON AFRICAN AMERICAN: 81 mL/min/{1.73_m2} (ref >59)
Globulin, Total: 2.2 g/dL (ref 1.5–4.5)
Glucose: 103 mg/dL — ABNORMAL HIGH (ref 65–99)
POTASSIUM: 4.3 mmol/L (ref 3.5–5.2)
Sodium: 143 mmol/L (ref 134–144)
Total Protein: 6.4 g/dL (ref 6.0–8.5)

## 2017-03-14 LAB — CBC WITH DIFFERENTIAL/PLATELET
BASOS: 0 %
Basophils Absolute: 0 10*3/uL (ref 0.0–0.2)
EOS (ABSOLUTE): 0.1 10*3/uL (ref 0.0–0.4)
EOS: 1 %
Hematocrit: 44.1 % (ref 37.5–51.0)
Hemoglobin: 14.9 g/dL (ref 13.0–17.7)
IMMATURE GRANS (ABS): 0 10*3/uL (ref 0.0–0.1)
IMMATURE GRANULOCYTES: 0 %
LYMPHS: 35 %
Lymphocytes Absolute: 1.7 10*3/uL (ref 0.7–3.1)
MCH: 29.6 pg (ref 26.6–33.0)
MCHC: 33.8 g/dL (ref 31.5–35.7)
MCV: 88 fL (ref 79–97)
MONOS ABS: 0.4 10*3/uL (ref 0.1–0.9)
Monocytes: 9 %
NEUTROS PCT: 55 %
Neutrophils Absolute: 2.6 10*3/uL (ref 1.4–7.0)
PLATELETS: 147 10*3/uL — AB (ref 150–379)
RBC: 5.03 x10E6/uL (ref 4.14–5.80)
RDW: 13.4 % (ref 12.3–15.4)
WBC: 4.9 10*3/uL (ref 3.4–10.8)

## 2017-03-14 LAB — C-REACTIVE PROTEIN: CRP: 1 mg/L (ref 0.0–4.9)

## 2017-03-14 LAB — TESTOSTERONE: TESTOSTERONE: 678 ng/dL (ref 264–916)

## 2017-03-15 ENCOUNTER — Telehealth: Payer: Self-pay | Admitting: Family Medicine

## 2017-03-15 NOTE — Telephone Encounter (Signed)
Pt aware of labs and aware he will get a letter

## 2017-03-18 LAB — THYROID PANEL WITH TSH
Free Thyroxine Index: 1.5 (ref 1.2–4.9)
T3 Uptake Ratio: 22 % — ABNORMAL LOW (ref 24–39)
T4 TOTAL: 6.8 ug/dL (ref 4.5–12.0)
TSH: 5.5 u[IU]/mL — ABNORMAL HIGH (ref 0.450–4.500)

## 2017-03-18 LAB — SPECIMEN STATUS REPORT

## 2017-03-19 ENCOUNTER — Telehealth: Payer: Self-pay | Admitting: Family Medicine

## 2017-03-19 MED ORDER — LIOTHYRONINE SODIUM 5 MCG PO TABS: 5.0000 ug | ORAL_TABLET | Freq: Every day | ORAL | 5 refills | Status: DC

## 2017-03-19 NOTE — Addendum Note (Signed)
Addended by: Karle Plumber on: 03/19/2017 02:34 PM   Modules accepted: Orders

## 2017-03-20 NOTE — Telephone Encounter (Signed)
Mailed labs to pt

## 2017-04-05 ENCOUNTER — Ambulatory Visit (INDEPENDENT_AMBULATORY_CARE_PROVIDER_SITE_OTHER): Payer: Medicare Other | Admitting: Family Medicine

## 2017-04-05 ENCOUNTER — Encounter: Payer: Self-pay | Admitting: Family Medicine

## 2017-04-05 VITALS — BP 172/91 | HR 82 | Temp 97.6°F | Ht 70.0 in | Wt 143.8 lb

## 2017-04-05 DIAGNOSIS — R634 Abnormal weight loss: Secondary | ICD-10-CM

## 2017-04-05 DIAGNOSIS — G479 Sleep disorder, unspecified: Secondary | ICD-10-CM

## 2017-04-05 MED ORDER — DOXEPIN HCL 6 MG PO TABS: 6.0000 mg | ORAL_TABLET | Freq: Every day | ORAL | 2 refills | Status: DC

## 2017-04-05 NOTE — Patient Instructions (Signed)
Great to see you!  We will call with your labs or send a letter within 1 week  Try doxepin 1 pill once daily at night for sleep.

## 2017-04-05 NOTE — Progress Notes (Signed)
   HPI  Patient presents today with concern about weight loss, hever mainly concerned about difficulty sleeping.  Patient explains that he's had unintentional weight loss over the last year, probably 15 pounds.  He had a breakup with his gifriend about 2 months ago,he states that since that times had dculty sleeping. He requests temazepam.he also requests that his PSA, B-12, vitamin D get chcked.  He denies suicidal thoughts. He does have some feelings of depression   He states that trazodone causes pain number type sympto  PMH: Smoking status noted ROS: Per HPI  Objective: BP (!) 172/91   Pulse 82   Temp 97.6 F (36.4 C) (Oral)   Ht 5\' 10"  (1.778 m)   Wt 143 lb 12.8 oz (65.2 kg)   BMI 20.63 kg/m  Gen: NAD, alert, cooperative with exam HEENT: NCAT CV: RRR, good S1/S2, no murmur Resp: CTABL, no wheezes, non-labored Abd: SNTND, BS present, no guarding or organomegaly Ext: No edema, warm Neuro: Alert and oriented, No gross deficits  Assessment and plan:  # Difficulty sleeping New problem Patient with some reactive depression from recent breakup Start doxepin for sleep Follow-up one month  # Weight loss New problem to me Patient with 10 pound weight loss in the last 6 months, he has not started the thyroid he requested. I recommended not starting that. Checking PSA today, also B-12 and vitamin D as requested Discussed multiple possible etiology Again offered endocrinology No significant history causing concern for elevated risk of cancer., FOBT, consider CT scout positive      Orders Placed This Encounter  Procedures  . Vitamin B12  . VITAMIN D 25 Hydroxy (Vit-D Deficiency, Fractures)  . PSA    Meds ordered this encounter  Medications  . Doxepin HCl 6 MG TABS    Sig: Take 1 tablet (6 mg total) by mouth at bedtime.    Dispense:  30 tablet    Refill:  Roman Forest, MD [facility] 04/05/2017, 10:10 AM

## 2017-04-06 LAB — VITAMIN D 25 HYDROXY (VIT D DEFICIENCY, FRACTURES): Vit D, 25-Hydroxy: 23.9 ng/mL — ABNORMAL LOW (ref 30.0–100.0)

## 2017-04-06 LAB — VITAMIN B12: VITAMIN B 12: 260 pg/mL (ref 232–1245)

## 2017-04-06 LAB — PSA: Prostate Specific Ag, Serum: 7.2 ng/mL — ABNORMAL HIGH (ref 0.0–4.0)

## 2017-04-08 ENCOUNTER — Other Ambulatory Visit: Payer: Self-pay | Admitting: *Deleted

## 2017-04-08 ENCOUNTER — Other Ambulatory Visit: Payer: Self-pay | Admitting: Family Medicine

## 2017-04-08 ENCOUNTER — Telehealth: Payer: Self-pay | Admitting: Family Medicine

## 2017-04-08 MED ORDER — VITAMIN D (ERGOCALCIFEROL) 1.25 MG (50000 UNIT) PO CAPS: 50000.0000 [IU] | ORAL_CAPSULE | ORAL | 0 refills | Status: DC

## 2017-04-08 NOTE — Telephone Encounter (Signed)
Pt aware of all labs 

## 2017-04-12 ENCOUNTER — Other Ambulatory Visit: Payer: Medicare Other

## 2017-04-12 DIAGNOSIS — Z1212 Encounter for screening for malignant neoplasm of rectum: Secondary | ICD-10-CM

## 2017-04-16 ENCOUNTER — Encounter: Payer: Self-pay | Admitting: Family Medicine

## 2017-04-16 LAB — FECAL OCCULT BLOOD, IMMUNOCHEMICAL: Fecal Occult Bld: NEGATIVE

## 2017-04-23 ENCOUNTER — Encounter: Payer: Self-pay | Admitting: Family Medicine

## 2017-04-23 ENCOUNTER — Ambulatory Visit (INDEPENDENT_AMBULATORY_CARE_PROVIDER_SITE_OTHER): Payer: Medicare Other | Admitting: Family Medicine

## 2017-04-23 VITALS — BP 172/93 | HR 85 | Temp 98.6°F | Ht 70.0 in | Wt 142.2 lb

## 2017-04-23 DIAGNOSIS — Z0000 Encounter for general adult medical examination without abnormal findings: Secondary | ICD-10-CM

## 2017-04-23 DIAGNOSIS — R972 Elevated prostate specific antigen [PSA]: Secondary | ICD-10-CM

## 2017-04-23 DIAGNOSIS — F329 Major depressive disorder, single episode, unspecified: Secondary | ICD-10-CM

## 2017-04-23 DIAGNOSIS — F32A Depression, unspecified: Secondary | ICD-10-CM

## 2017-04-23 MED ORDER — MIRTAZAPINE 7.5 MG PO TABS: 7.5000 mg | ORAL_TABLET | Freq: Every day | ORAL | 3 refills | Status: DC

## 2017-04-23 NOTE — Progress Notes (Signed)
   HPI  Patient presents today here for an annual physical exam.  Patient also with severe depression after recent breakup. He's also having difficulty sleeping. He requests temazepam, we have discussed this previously- I would not recommend this medication at his age. Doxepin did help his sleep, however he had some residual grogginess. Patient's depression seems to be getting worse, he denies any suicidal thoughts. He did not like the feeling that Prozac a gave him 20 years ago.  Patient is reasonably active, he watches his diet closely. He has an unusual symptom, he develops sleepiness and fatigue after eating fresh tomatoes, onions, or herbal tea. He states that he is sensitive to medications  PMH: Smoking status noted ROS: Per HPI  Objective: BP (!) 172/93   Pulse 85   Temp 98.6 F (37 C) (Oral)   Ht 5\' 10"  (1.778 m)   Wt 142 lb 3.2 oz (64.5 kg)   BMI 20.40 kg/m  Gen: NAD, alert, cooperative with exam HEENT: NCAT, EOMI, PERRL, oropharynx moist and clear, nares clear CV: RRR, good S1/S2, no murmur Resp: CTABL, no wheezes, non-labored Abd: SNTND, BS present, no guarding or organomegaly Ext: No edema, warm Neuro: Alert and oriented, 2+ patellar tendon reflexes bilaterally, strength 5/5 and sensation intact bilaterally in lower extremities DRE Enlarged normal texture prostate with slightly larger right lobe compared to the left, no nodules, nontender  Assessment and plan:  # Annual physical exam Patient with difficult time recently with depression, see below Normal exam, except for gradual weight loss over last 8 months. This is a proximally 10 pounds He reports reduced appetite over the last few weeks with the breakup, however he has eaten well before that. Labs are up-to-date  # Elevated PSA Mildly elevated given his age Recommended seeing urology to discuss his elevated PSA, he previously resisted this, he still resist but is begrudgingly willing. With trying to  explain his weight loss this is the only worrisome finding I have found so far Refer to urology  # Depression With difficulty sleeping Remeron, follow-up in 3-4 weeks   Meds ordered this encounter  Medications  . mirtazapine (REMERON) 7.5 MG tablet    Sig: Take 1 tablet (7.5 mg total) by mouth at bedtime.    Dispense:  30 tablet    Refill:  [place], MD [facility] 04/23/2017, 11:47 AM

## 2017-04-23 NOTE — Addendum Note (Signed)
Addended by: Timmothy Euler on: 04/23/2017 11:49 AM   Modules accepted: Orders

## 2017-04-23 NOTE — Patient Instructions (Signed)
Great to see you!  I would highly recommend seeing Urology to discuss your PSA.

## 2017-05-28 ENCOUNTER — Ambulatory Visit (INDEPENDENT_AMBULATORY_CARE_PROVIDER_SITE_OTHER): Payer: Medicare Other | Admitting: Family Medicine

## 2017-05-28 ENCOUNTER — Encounter: Payer: Self-pay | Admitting: Family Medicine

## 2017-05-28 VITALS — BP 157/75 | HR 69 | Temp 97.5°F | Ht 70.0 in | Wt 144.2 lb

## 2017-05-28 DIAGNOSIS — H538 Other visual disturbances: Secondary | ICD-10-CM | POA: Diagnosis not present

## 2017-05-28 NOTE — Progress Notes (Signed)
   HPI  Patient presents today for fatigue and weakness, and blurred vision.  Patient states the symptoms of been going on for a few years but worse over the last 3-4 months. Almost any medication seems to cause blurred vision, fatigue, weakness. He is tolerating food and fluids like usual except for raw vegetables as previously noted.  His mood has improved quite a bit. He has stopped taking Remeron.  After his MMSE the patient states that he's had an episode of blurred vision without any kind of medication associated or any kind of vitamin.  No diplopia  PMH: Smoking status noted ROS: Per HPI  Objective: BP (!) 157/75   Pulse 69   Temp (!) 97.5 F (36.4 C) (Oral)   Ht 5\' 10"  (1.778 m)   Wt 144 lb 3.2 oz (65.4 kg)   BMI 20.69 kg/m  Gen: NAD, alert, cooperative with exam HEENT: NCAT, EOMI, PERRL CV: RRR, good S1/S2, no murmur Resp: CTABL, no wheezes, non-labored Ext: No edema, warm Neuro: Alert and oriented, cranial nerves II through XII intact, strength 5/5 and sensation intact in all 4 extremities, normal gait  MMSE: 29/30  MMSE - Mini Mental State Exam 05/28/2017  Orientation to time 5  Orientation to Place 5  Registration 3  Attention/ Calculation 5  Recall 3  Language- name 2 objects 2  Language- repeat 1  Language- follow 3 step command 3  Language- read & follow direction 1  Write a sentence 1  Copy design 0  Total score 29      Assessment and plan:  # Blurred vision, fatigue Unclear etiology, patient is very concerned that he may have intracranial abnormality, I do not see that an MRI is medically necessary today. Recommended ophthalmology evaluation, also given his concern I will refer him to neurology      Orders Placed This Encounter  Procedures  . Ambulatory referral to Neurology    Referral Priority:   Routine    Referral Type:   Consultation    Referral Reason:   Specialty Services Required    Requested Specialty:   Neurology   Number of Visits Requested:   Bonny Doon, MD [facility] 05/28/2017, 11:42 AM

## 2017-05-30 ENCOUNTER — Telehealth: Payer: Self-pay | Admitting: Family Medicine

## 2017-05-30 NOTE — Telephone Encounter (Signed)
Pt aware that no MRI has been ordered. He does want to see a neurologist at [facility] this in the referral workqueue. Will call him with appointment date/time

## 2017-06-13 ENCOUNTER — Encounter: Payer: Self-pay | Admitting: Neurology

## 2017-06-17 ENCOUNTER — Ambulatory Visit (INDEPENDENT_AMBULATORY_CARE_PROVIDER_SITE_OTHER): Payer: Medicare Other | Admitting: Neurology

## 2017-06-17 ENCOUNTER — Encounter: Payer: Self-pay | Admitting: Neurology

## 2017-06-17 VITALS — BP 130/78 | HR 90 | Ht 70.0 in | Wt 144.8 lb

## 2017-06-17 DIAGNOSIS — R5383 Other fatigue: Secondary | ICD-10-CM | POA: Diagnosis not present

## 2017-06-17 DIAGNOSIS — H538 Other visual disturbances: Secondary | ICD-10-CM | POA: Diagnosis not present

## 2017-06-17 NOTE — Patient Instructions (Signed)
I do not believe that your symptoms are related to anything in the brain, such as a tumor.  Therefore, I do not recommend imaging of the head.  Follow up with Dr. Wendi Snipes.

## 2017-06-17 NOTE — Progress Notes (Addendum)
NEUROLOGY CONSULTATION NOTE  Miguel Hill MRN: [id number] DOB: [birth year]  Referring provider: Dr. Wendi Snipes Primary care provider: Dr. Wendi Snipes  Reason for consult:  Blurred vision, fatigue  HISTORY OF PRESENT ILLNESS: Miguel Hill is an 83 year old male who presents for fatigue and blurred vision.  He is accompanied by his wife who supplements history.  For about 2 to 3 years, he has had episodic fatigue.  It is not spontaneous.  It initially occurred about 20 minutes after taking any medication.  Then, it started to occur after taking supplemental vitamins or herbal tea.  Later, it started to occur after ingesting certain foods such as raw vegetables, ginger ale or tomato juice.  He describes a feeling of fatigue and lethargy.  It lasts the entire day.  He needs to nap.  He also feels a mild dull headache.  More recently, he has associated blurred vision as well.  He denies dizziness, spinning, unilateral numbness or weakness.  He has low back pain and sometimes stumbles a little.  Recent B12 was 260 and vitamin D was 23.9.  He was started on weekly D supplementation.  He is symptomatic after each dose.  He cannot take B12 due to the lethargy and fatigue.  Testosterone was 678.  TSH was 5.500 and T3 was 22 but T4 was 6.8 and free thyroxine index was 1.5.  He stopped Cytomel due to these side effects.  He does report anxiety.  He has insomnia.  Sleep study reportedly did not reveal sleep apnea.  He does not perform routine exercise.  PAST MEDICAL HISTORY: Past Medical History:  Diagnosis Date  . Prediabetes     PAST SURGICAL HISTORY: Past Surgical History:  Procedure Laterality Date  . HERNIA REPAIR      MEDICATIONS: Current Outpatient Prescriptions on File Prior to Visit  Medication Sig Dispense Refill  . ACCU-CHEK AVIVA PLUS test strip     . sildenafil (REVATIO) 20 MG tablet Take 2 to 5 pills once daily as needed for erectile dysfunction 20 tablet 2  . Vitamin D,  Ergocalciferol, (DRISDOL) 50000 units CAPS capsule Take 1 capsule (50,000 Units total) by mouth every 7 (seven) days. 12 capsule 0   No current facility-administered medications on file prior to visit.     ALLERGIES: No Known Allergies  FAMILY HISTORY: Family History  Problem Relation Age of Onset  . Heart attack Father     SOCIAL HISTORY: Social History   Social History  . Marital status: Divorced    Spouse name: N/A  . Number of children: N/A  . Years of education: N/A   Occupational History  . Not on file.   Social History Main Topics  . Smoking status: Former Smoker    Quit date: 04/03/1985  . Smokeless tobacco: Current User    Types: Chew     Comment: rarely uses chewing tobacco  . Alcohol use No  . Drug use: No  . Sexual activity: Not on file   Other Topics Concern  . Not on file   Social History Narrative  . No narrative on file    REVIEW OF SYSTEMS: Constitutional: No fevers, chills, or sweats, no generalized fatigue, change in appetite Eyes: No visual changes, double vision, eye pain Ear, nose and throat: No hearing loss, ear pain, nasal congestion, sore throat Cardiovascular: No chest pain, palpitations Respiratory:  No shortness of breath at rest or with exertion, wheezes GastrointestinaI: No nausea, vomiting, diarrhea, abdominal pain, fecal incontinence Genitourinary:  No  dysuria, urinary retention or frequency Musculoskeletal:  No neck pain, back pain Integumentary: No rash, pruritus, skin lesions Neurological: as above Psychiatric: No depression, insomnia, anxiety Endocrine: No palpitations, fatigue, diaphoresis, mood swings, change in appetite, change in weight, increased thirst Hematologic/Lymphatic:  No purpura, petechiae. Allergic/Immunologic: no itchy/runny eyes, nasal congestion, recent allergic reactions, rashes  PHYSICAL EXAM: Vitals:   06/17/17 0953  BP: 130/78  Pulse: 90  SpO2: 99%   General: No acute distress.  Patient appears  well-groomed.  Head:  Normocephalic/atraumatic Eyes:  fundi examined but not visualized Neck: supple, no paraspinal tenderness, full range of motion Back: No paraspinal tenderness Heart: regular rate and rhythm Lungs: Clear to auscultation bilaterally. Vascular: No carotid bruits. Neurological Exam: Mental status: alert and oriented to person, place, and time, recent and remote memory intact, fund of knowledge intact, attention and concentration intact, speech fluent and not dysarthric, language intact. Cranial nerves: CN I: not tested CN II: pupils equal, round and reactive to light, visual fields intact CN III, IV, VI:  full range of motion, no nystagmus, no ptosis CN V: facial sensation intact CN VII: upper and lower face symmetric CN VIII: hearing intact CN IX, X: gag intact, uvula midline CN XI: sternocleidomastoid and trapezius muscles intact CN XII: tongue midline Bulk & Tone: normal, no fasciculations. Motor:  5/5 throughout  Sensation:  Pinprick sensation intact and vibration sensation reduced in toes. Deep Tendon Reflexes:  2+ throughout, toes downgoing. Finger to nose testing:  Without dysmetria.  Heel to shin:  Without dysmetria.  Gait:  Mildly wide-based gait.  Able to turn. Romberg negative.  IMPRESSION: 1.  Episodic fatigue and blurred vision only provoked after ingesting medication, vitamins and certain foods.  I do not believe this is neurologic or warranting imaging of the head.  His neurologic exam is non-focal.  Fatigue may be thyroid related or due to borderline low B12, but he defers treatment because he says it precipitates these symptoms.  Anxiety may be playing a role. 2.  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  Thank you for allowing me to take part in the care of this patient.  Metta Clines, DO  CC:  Kenn File, MD

## 2017-06-30 ENCOUNTER — Other Ambulatory Visit: Payer: Self-pay | Admitting: Family Medicine

## 2017-08-01 ENCOUNTER — Ambulatory Visit: Payer: Medicare Other | Admitting: Family Medicine

## 2017-08-01 ENCOUNTER — Encounter: Payer: Self-pay | Admitting: Family Medicine

## 2017-08-01 VITALS — BP 161/81 | HR 71 | Temp 97.3°F | Ht 70.0 in | Wt 145.0 lb

## 2017-08-01 DIAGNOSIS — N3000 Acute cystitis without hematuria: Secondary | ICD-10-CM

## 2017-08-01 LAB — URINALYSIS
Bilirubin, UA: NEGATIVE
Glucose, UA: NEGATIVE
LEUKOCYTES UA: NEGATIVE
NITRITE UA: NEGATIVE
PH UA: 5.5 (ref 5.0–7.5)
Protein, UA: NEGATIVE
SPEC GRAV UA: 1.025 (ref 1.005–1.030)
UUROB: 1 mg/dL (ref 0.2–1.0)

## 2017-08-01 MED ORDER — CEPHALEXIN 500 MG PO CAPS: 500.0000 mg | ORAL_CAPSULE | Freq: Four times a day (QID) | ORAL | 0 refills | Status: DC

## 2017-08-01 NOTE — Progress Notes (Signed)
BP (!) 161/81 (BP Location: Left Arm)   Pulse 71   Temp (!) 97.3 F (36.3 C) (Oral)   Ht 5\' 10"  (1.778 m)   Wt 145 lb (65.8 kg)   BMI 20.81 kg/m    Subjective:    Patient ID: Miguel Hill, male    DOB: [birth year], 83 y.o.   MRN: [id number]  HPI: Miguel Hill is a 83 y.o. male presenting on 08/01/2017 for Urinary Tract Infection   HPI Dysuria and burning while urination The patient has been having dysuria and burning with urination the been going on for the past 2 days.  He denies any fevers or chills or flank pain.  He has gotten this once previously but does not get this frequently.  He was concerned about whether it could be prostate or not.  He says he is also had some odor with urination.  He is also been having frequency.  Relevant past medical, surgical, family and social history reviewed and updated as indicated. Interim medical history since our last visit reviewed. Allergies and medications reviewed and updated.  Review of Systems  Constitutional: Negative for chills and fever.  Respiratory: Negative for shortness of breath and wheezing.   Cardiovascular: Negative for chest pain and leg swelling.  Gastrointestinal: Negative for abdominal pain.  Genitourinary: Positive for dysuria and frequency. Negative for discharge, flank pain, hematuria, penile pain, penile swelling, scrotal swelling, testicular pain and urgency.  Musculoskeletal: Negative for back pain and gait problem.  Skin: Negative for rash.  All other systems reviewed and are negative.   Per HPI unless specifically indicated above        Objective:    BP (!) 161/81 (BP Location: Left Arm)   Pulse 71   Temp (!) 97.3 F (36.3 C) (Oral)   Ht 5\' 10"  (1.778 m)   Wt 145 lb (65.8 kg)   BMI 20.81 kg/m   Wt Readings from Last 3 Encounters:  08/01/17 145 lb (65.8 kg)  06/17/17 144 lb 12.8 oz (65.7 kg)  05/28/17 144 lb 3.2 oz (65.4 kg)    Physical Exam  Constitutional: He is oriented to person,  place, and time. He appears well-developed and well-nourished. No distress.  Eyes: Conjunctivae are normal. No scleral icterus.  Neck: Neck supple. No thyromegaly present.  Cardiovascular: Normal rate, regular rhythm, normal heart sounds and intact distal pulses.  No murmur heard. Pulmonary/Chest: Effort normal and breath sounds normal. No respiratory distress. He has no wheezes. He has no rales.  Abdominal: Soft. Bowel sounds are normal. He exhibits no distension. There is no tenderness. There is no rebound.  Genitourinary: Prostate is enlarged. Prostate is not tender.  Musculoskeletal: Normal range of motion. He exhibits no edema.  Lymphadenopathy:    He has no cervical adenopathy.  Neurological: He is alert and oriented to person, place, and time. Coordination normal.  Skin: Skin is warm and dry. No rash noted. He is not diaphoretic.  Psychiatric: He has a normal mood and affect. His behavior is normal.  Nursing note and vitals reviewed.   Urinalysis: Trace ketones, 2+ blood, otherwise negative, will send culture, dip only     Assessment & Plan:   Problem List Items Addressed This Visit    None    Visit Diagnoses    Acute cystitis without hematuria    -  Primary   Relevant Medications   cephALEXin (KEFLEX) 500 MG capsule   Other Relevant Orders   Urine Culture   Urinalysis (Completed)  Follow up plan: Return if symptoms worsen or fail to improve.  Counseling provided for all of the vaccine components Orders Placed This Encounter  Procedures  . Urine Culture  . Urinalysis    Caryl Pina, MD [facility] 08/01/2017, 6:48 PM

## 2017-08-06 LAB — URINE CULTURE

## 2017-09-27 ENCOUNTER — Ambulatory Visit: Payer: Self-pay | Admitting: Neurology

## 2017-10-21 ENCOUNTER — Other Ambulatory Visit: Payer: Self-pay | Admitting: Family Medicine

## 2017-12-16 ENCOUNTER — Encounter: Payer: Self-pay | Admitting: Pediatrics

## 2017-12-16 ENCOUNTER — Ambulatory Visit: Payer: Medicare Other | Admitting: Pediatrics

## 2017-12-16 ENCOUNTER — Telehealth: Payer: Self-pay | Admitting: Family Medicine

## 2017-12-16 VITALS — BP 180/94 | HR 74 | Temp 97.9°F | Ht 70.0 in | Wt 148.0 lb

## 2017-12-16 DIAGNOSIS — R739 Hyperglycemia, unspecified: Secondary | ICD-10-CM | POA: Diagnosis not present

## 2017-12-16 DIAGNOSIS — R82998 Other abnormal findings in urine: Secondary | ICD-10-CM | POA: Diagnosis not present

## 2017-12-16 DIAGNOSIS — E559 Vitamin D deficiency, unspecified: Secondary | ICD-10-CM

## 2017-12-16 DIAGNOSIS — R319 Hematuria, unspecified: Secondary | ICD-10-CM

## 2017-12-16 DIAGNOSIS — I10 Essential (primary) hypertension: Secondary | ICD-10-CM | POA: Diagnosis not present

## 2017-12-16 LAB — URINALYSIS, COMPLETE
Bilirubin, UA: NEGATIVE
GLUCOSE, UA: NEGATIVE
KETONES UA: NEGATIVE
Leukocytes, UA: NEGATIVE
Nitrite, UA: NEGATIVE
PROTEIN UA: NEGATIVE
SPEC GRAV UA: 1.01 (ref 1.005–1.030)
UUROB: 0.2 mg/dL (ref 0.2–1.0)
pH, UA: 7 (ref 5.0–7.5)

## 2017-12-16 LAB — BAYER DCA HB A1C WAIVED: HB A1C: 6.1 % (ref <7.0)

## 2017-12-16 LAB — MICROSCOPIC EXAMINATION
Bacteria, UA: NONE SEEN
Epithelial Cells (non renal): NONE SEEN /hpf (ref 0–10)
Renal Epithel, UA: NONE SEEN /hpf
WBC UA: NONE SEEN /HPF (ref 0–5)

## 2017-12-16 MED ORDER — AMLODIPINE BESYLATE 5 MG PO TABS: 5.0000 mg | ORAL_TABLET | Freq: Every day | ORAL | 3 refills | Status: DC

## 2017-12-16 NOTE — Telephone Encounter (Signed)
Yes, take half tab every day instead of full tab. Check BPs daily, bring to next visit in two weeks AND bring BP cuff so we can check it. Any lightheadedness let us know.

## 2017-12-16 NOTE — Telephone Encounter (Signed)
Please review and advise.

## 2017-12-16 NOTE — Patient Instructions (Addendum)
[employer] transportation within [facility] [phone] for help with transportation to appointments No appointments after 3pm Will need 3 day notice $6 fare total  Check blood pressures at home. Write down and bring to next office visit.  You should hear within the week about follow up with urology.

## 2017-12-16 NOTE — Progress Notes (Signed)
Subjective:   Patient ID: Miguel Hill, male    DOB: 07-19-1933, 84 y.o.   MRN: [id number] CC: Amber colored urine HPI: Miguel Hill is a 84 y.o. male   The past 6 weeks he has noticed his voids have been darker than usual.  He denies Coca-Cola color or tea color, says it is more than amber color.  He has not seen any blood in his urine.  He does not have any trouble emptying his bladder.  On recent annual exam six mo ago with PCP he was noted to have an enlarged normal texture prostate with slightly larger right lobe compared to left lobe.  PSA drawn at that time was elevated at 7.2.  He was referred to urology but has not yet followed up with them.  Transportation has been hard.  Blood pressure today elevated.  He says he has a blood pressure cuff at home but has not recently been checking.  He denies any headaches.  He does sometimes have blurry vision.  No fevers.  He recently had some weight loss but has gained some of that weight back.  He says his appetite is been fine.  He requests his vitamin D level and average blood sugar rechecked.  Relevant past medical, surgical, family and social history reviewed. Allergies and medications reviewed and updated. Social History   Tobacco Use  Smoking Status Former Smoker  . Last attempt to quit: 04/03/1985  . Years since quitting: 32.7  Smokeless Tobacco Current User  . Types: Chew  Tobacco Comment   rarely uses chewing tobacco   ROS: Per HPI   Objective:    BP (!) 180/94   Pulse 74   Temp 97.9 F (36.6 C) (Oral)   Ht 5' 10" (1.778 m)   Wt 148 lb (67.1 kg)   BMI 21.24 kg/m   Wt Readings from Last 3 Encounters:  12/16/17 148 lb (67.1 kg)  08/01/17 145 lb (65.8 kg)  06/17/17 144 lb 12.8 oz (65.7 kg)    Gen: NAD, alert, cooperative with exam, NCAT EYES: EOMI, no conjunctival injection, or no icterus ENT: OP without erythema LYMPH: no cervical LAD CV: NRRR, normal S1/S2, no murmur, distal pulses 2+ b/l Resp: CTABL, no  wheezes Abd: +BS, soft, NTND. Ext: No edema, warm Neuro: Alert and oriented, strength equal b/l UE and LE, coordination grossly normal MSK: normal muscle bulk  Assessment & Plan:  84 year old male with elevated blood pressure, hematuria, elevated PSA.  Diagnoses and all orders for this visit:  Essential hypertension Persistently very elevated last few visits.  Patient called back after OV from home, saying blood pressure 130s/70s on his blood pressure cuff.  Given some mortality benefit to treating whitecoat hypertension, will start him on 2.5 mg amlodipine, patient is to check his blood pressures regularly and follow-up with his PCP in 2 weeks.  Bring blood pressure readings and cuff to next office visit.  Any lightheadedness let us know. -     amLODipine (NORVASC) 5 MG tablet; Take 2.56m daily-     BMP8+EGFR  Dark urine -     Urinalysis, Complete  Hematuria, unspecified type With hematuria last December as well, at that time he was treated for cystitis.  No acute lower urinary tract symptoms now though with persistent microscopic hematuria.  Recent elevated PSA along with enlarged prostate on exam in September 2018 with PCP. -     Urine Culture -     Ambulatory referral to Urology  Vitamin D deficiency -  VITAMIN D 25 Hydroxy (Vit-D Deficiency, Fractures)  Hyperglycemia -     Bayer DCA Hb A1c Waived  Other orders -     Microscopic Examination   Follow up plan: 2 wee with PCP.  Gave information for [employer] to help with transportation within the county. Assunta Found, MD [facility]

## 2017-12-17 LAB — BMP8+EGFR
BUN/Creatinine Ratio: 19 (ref 10–24)
BUN: 17 mg/dL (ref 8–27)
CALCIUM: 9.3 mg/dL (ref 8.6–10.2)
CHLORIDE: 104 mmol/L (ref 96–106)
CO2: 20 mmol/L (ref 20–29)
Creatinine, Ser: 0.91 mg/dL (ref 0.76–1.27)
GFR calc Af Amer: 90 mL/min/{1.73_m2} (ref >59)
GFR, EST NON AFRICAN AMERICAN: 78 mL/min/{1.73_m2} (ref >59)
Glucose: 83 mg/dL (ref 65–99)
POTASSIUM: 4.4 mmol/L (ref 3.5–5.2)
Sodium: 141 mmol/L (ref 134–144)

## 2017-12-17 LAB — URINE CULTURE

## 2017-12-17 LAB — VITAMIN D 25 HYDROXY (VIT D DEFICIENCY, FRACTURES): Vit D, 25-Hydroxy: 31.3 ng/mL (ref 30.0–100.0)

## 2017-12-17 MED ORDER — AMLODIPINE BESYLATE 5 MG PO TABS: 2.5000 mg | ORAL_TABLET | Freq: Every day | ORAL | 3 refills | Status: DC

## 2017-12-19 ENCOUNTER — Telehealth: Payer: Self-pay | Admitting: Family Medicine

## 2017-12-30 ENCOUNTER — Ambulatory Visit: Payer: Medicare Other | Admitting: Family Medicine

## 2017-12-30 ENCOUNTER — Encounter: Payer: Self-pay | Admitting: Family Medicine

## 2017-12-30 VITALS — BP 163/81 | HR 62 | Temp 97.0°F | Ht 70.0 in | Wt 146.4 lb

## 2017-12-30 DIAGNOSIS — I10 Essential (primary) hypertension: Secondary | ICD-10-CM

## 2017-12-30 DIAGNOSIS — L84 Corns and callosities: Secondary | ICD-10-CM | POA: Diagnosis not present

## 2017-12-30 NOTE — Progress Notes (Signed)
   HPI  Patient presents today for follow-up hypertension.  Patient was seen about 2 weeks ago and had blood pressure of 180/94. Patient denies headache or chest pain. Patient states he started taking 1/2 tablet of amlodipine for about 3 days and stopped after that due to feeling weak and tired.  He states his blood pressure at home is been in the 130s over 80s on average.  Foot callus Left foot pain for about 1 month.  He is developed a callus for no apparent reason.   PMH: Smoking status noted ROS: Per HPI  Objective: BP (!) 163/81   Pulse 62   Temp (!) 97 F (36.1 C) (Oral)   Ht 5\' 10"  (1.778 m)   Wt 146 lb 6.4 oz (66.4 kg)   BMI 21.01 kg/m  Gen: NAD, alert, cooperative with exam HEENT: NCAT CV: RRR, good S1/S2, no murmur Resp: CTABL, no wheezes, non-labored Ext: No edema, warm Neuro: Alert and oriented, No gross deficits Left foot with thick callus proximally 1 cm diameter at the distal foot  Assessment and plan:  #Hypertension Elevated today Patient has not tolerated medications well in the past, he will come back with his blood pressure log and his blood pressure cuff for nursing to check. Consider very low-dose lisinopril,  5 mg for tolerance if needed  #Foot callus Patient with left foot callus, discussed Compound W and similar medications as well as corn pads Offered podiatry which he declines for now.   Laroy Apple, MD [facility] 12/30/2017, 11:04 AM

## 2017-12-30 NOTE — Patient Instructions (Signed)
Great to see you!  Come back in 1 month for blood pressure check.   Come back n 1 week or so with your BP log and bring in your cuff to check with nursing.

## 2018-01-01 NOTE — Telephone Encounter (Signed)
Patient was seen 5/13- this encounter will be closed.

## 2018-01-23 ENCOUNTER — Ambulatory Visit (INDEPENDENT_AMBULATORY_CARE_PROVIDER_SITE_OTHER): Payer: Medicare Other | Admitting: *Deleted

## 2018-01-23 DIAGNOSIS — I10 Essential (primary) hypertension: Secondary | ICD-10-CM

## 2018-01-23 NOTE — Progress Notes (Signed)
Pt here for BP check BP 181 99 P 73 on pt's machine  BP 166 80 P 65 on our machine  Rck BP 159 77 P 65    appt scheduled for 1 mth rck with Dr Wendi Snipes on 01/30/2018

## 2018-01-28 IMAGING — CR DG KNEE 1-2V*R*
2 series · 2 of 2 positions shown · non-contrast
Comparison: None.

CLINICAL DATA: Right knee pain.

EXAM:
RIGHT KNEE - 1-2 VIEW

[view not recorded (1 of 2)]
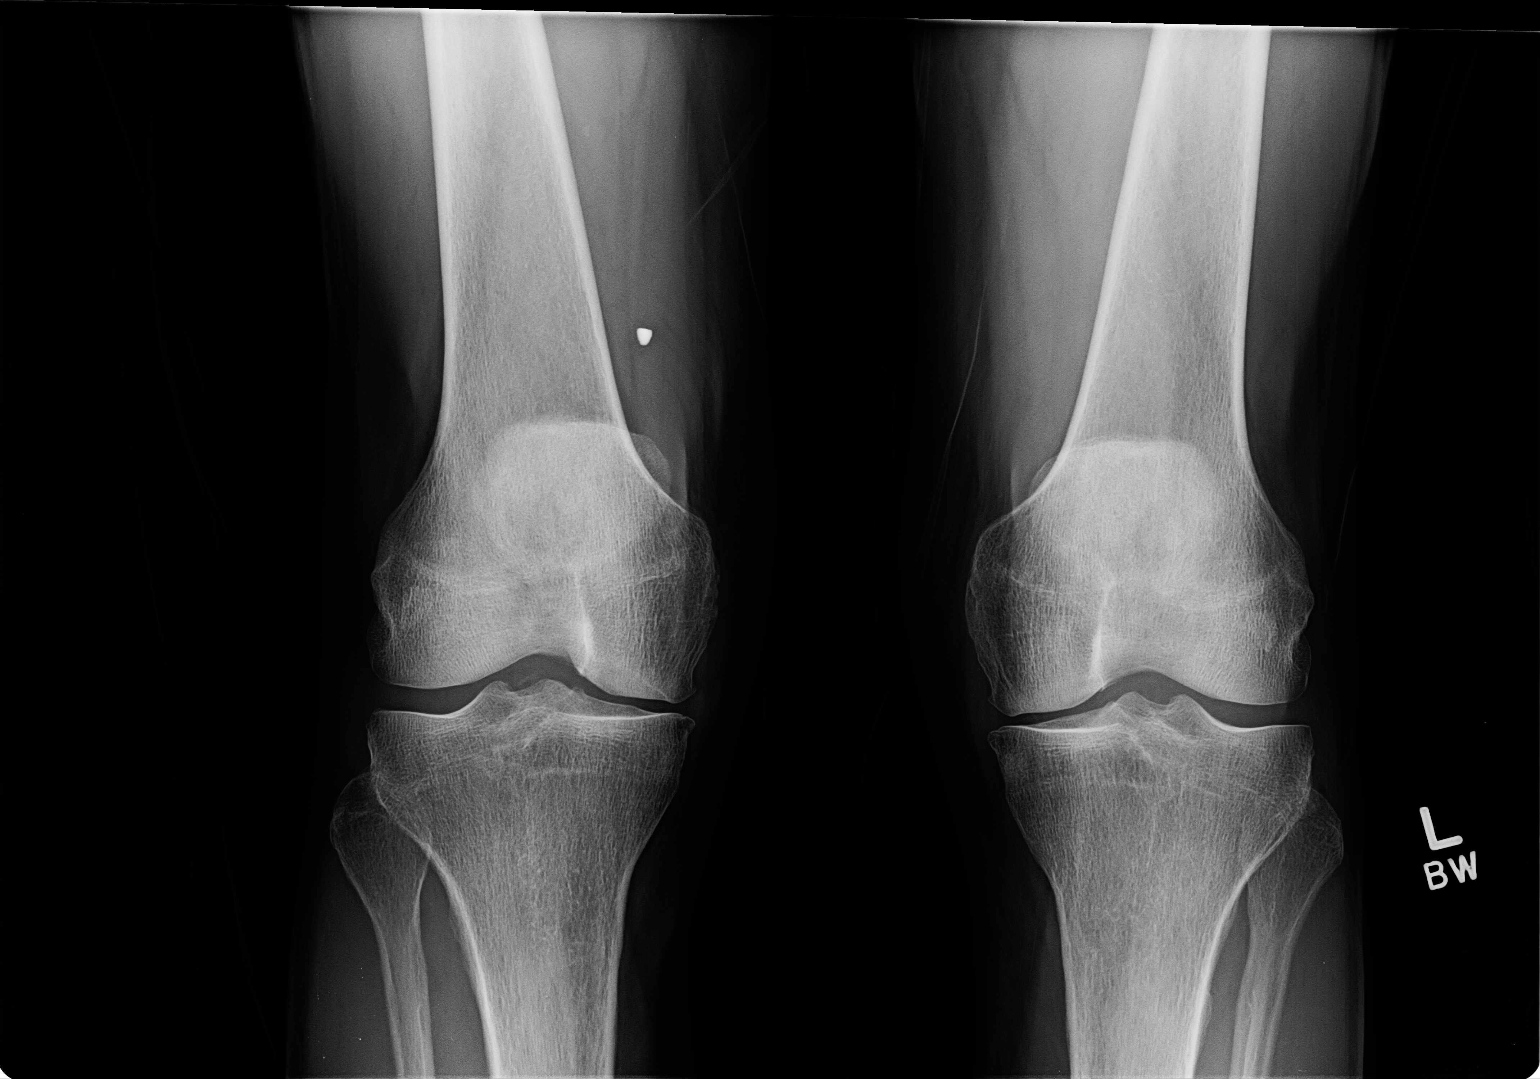

[view not recorded (2 of 2)]
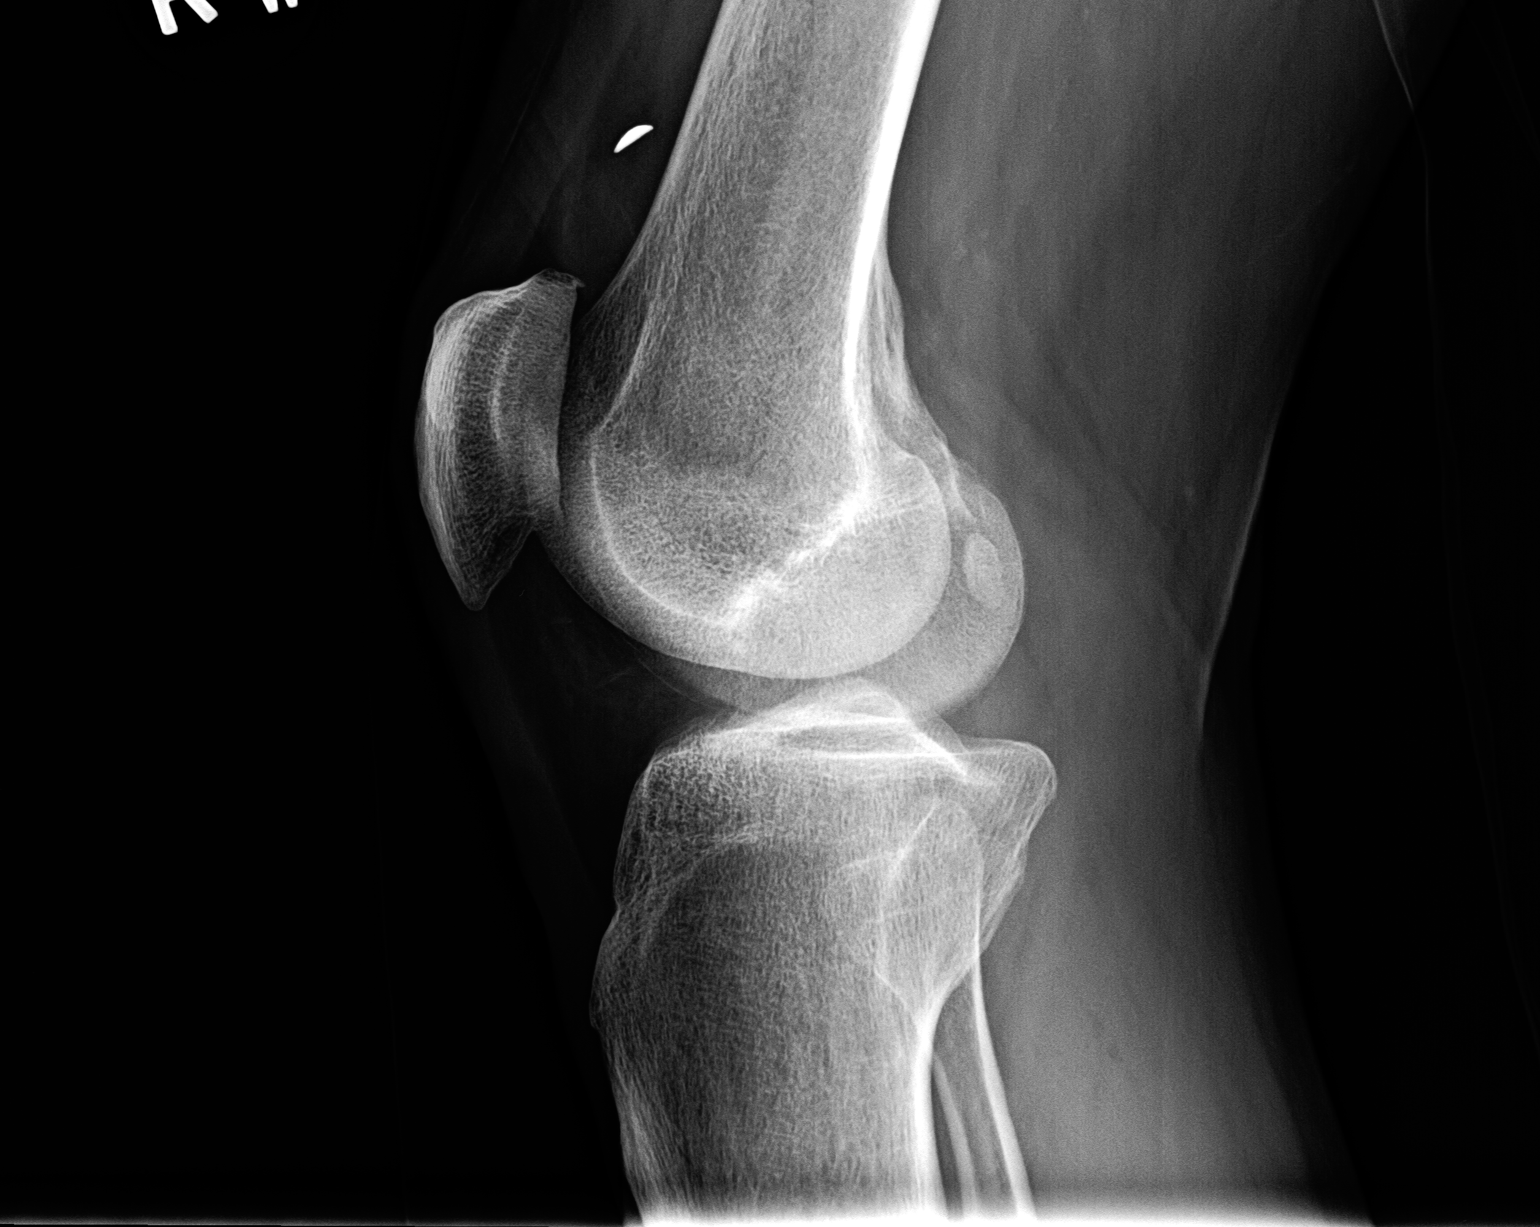

[2 of 2 positions shown; findings below may reference images not displayed]

FINDINGS: The right knee is located. No acute fracture or knee joint effusion
is identified. A curvilinear 7 mm metallic foreign body projects in
the right suprapatellar region medially. There is minimal patellar
spurring. Mild medial compartment joint space narrowing is present
bilaterally with associated mild osteophyte formation. No lytic or
blastic osseous lesion is identified.
IMPRESSION: 1. No acute osseous abnormality identified.
2. Mild bilateral medial compartment osteoarthrosis.
3. 7 mm metallic foreign body in the right suprapatellar region.

## 2018-01-30 ENCOUNTER — Ambulatory Visit: Payer: Medicare Other | Admitting: Family Medicine

## 2018-02-05 ENCOUNTER — Ambulatory Visit: Payer: Medicare Other | Admitting: Urology

## 2018-02-23 ENCOUNTER — Other Ambulatory Visit: Payer: Self-pay | Admitting: Family Medicine

## 2018-02-25 ENCOUNTER — Other Ambulatory Visit: Payer: Self-pay | Admitting: Pediatrics

## 2018-02-26 NOTE — Telephone Encounter (Signed)
Last Vit D 12/16/17  31.3

## 2018-03-14 ENCOUNTER — Other Ambulatory Visit (HOSPITAL_COMMUNITY)
Admission: RE | Admit: 2018-03-14 | Discharge: 2018-03-14 | Disposition: A | Discharge status: Home / Self Care | Payer: Medicare Other | Source: Other Acute Inpatient Hospital | Attending: Urology | Admitting: Urology

## 2018-03-14 ENCOUNTER — Ambulatory Visit: Payer: Medicare Other | Admitting: Urology

## 2018-03-14 DIAGNOSIS — R350 Frequency of micturition: Secondary | ICD-10-CM

## 2018-03-14 DIAGNOSIS — Z87440 Personal history of urinary (tract) infections: Secondary | ICD-10-CM

## 2018-03-14 DIAGNOSIS — R311 Benign essential microscopic hematuria: Secondary | ICD-10-CM | POA: Insufficient documentation

## 2018-03-14 DIAGNOSIS — N401 Enlarged prostate with lower urinary tract symptoms: Secondary | ICD-10-CM | POA: Diagnosis not present

## 2018-03-14 DIAGNOSIS — R972 Elevated prostate specific antigen [PSA]: Secondary | ICD-10-CM

## 2018-03-14 LAB — URINALYSIS, COMPLETE (UACMP) WITH MICROSCOPIC
BILIRUBIN URINE: NEGATIVE
Bacteria, UA: NONE SEEN
GLUCOSE, UA: NEGATIVE mg/dL
KETONES UR: NEGATIVE mg/dL
Leukocytes, UA: NEGATIVE
NITRITE: NEGATIVE
PH: 6 (ref 5.0–8.0)
PROTEIN: NEGATIVE mg/dL
Specific Gravity, Urine: 1.005 (ref 1.005–1.030)

## 2018-03-15 LAB — URINE CULTURE: Culture: NO GROWTH

## 2018-03-21 ENCOUNTER — Other Ambulatory Visit: Payer: Self-pay | Admitting: Urology

## 2018-03-21 DIAGNOSIS — R3129 Other microscopic hematuria: Secondary | ICD-10-CM

## 2018-05-09 ENCOUNTER — Ambulatory Visit (HOSPITAL_COMMUNITY): Admission: RE | Admit: 2018-05-09 | Payer: Medicare Other | Source: Ambulatory Visit

## 2018-05-09 ENCOUNTER — Ambulatory Visit: Payer: Medicare Other | Admitting: Urology

## 2018-06-06 ENCOUNTER — Telehealth: Payer: Self-pay | Admitting: Family Medicine

## 2018-06-06 NOTE — Telephone Encounter (Signed)
FYI, patient had a skin lesion removed by Dr. Wendi Snipes in February of 2018 that has came back. The patient wanted to know what he should do.  At the time, the pathology report showed it to be a basal cell carcinoma.  I advised him that he should see someone here and we would do that referral for him.  He reports his insurance does not require a referral, so he will contact his dermatologist, Dr. Tarri Glenn, in [place] to make this appointment.  I am mailing him a copy of the pathology report and mailing it to him so that he will have this for visit with the dermatologist.  Any other recommendation, please let me know.

## 2018-06-06 NOTE — Telephone Encounter (Signed)
Thank you. That sounds great. WS

## 2018-06-27 ENCOUNTER — Other Ambulatory Visit: Payer: Medicare Other

## 2018-07-04 ENCOUNTER — Ambulatory Visit: Payer: Medicare Other | Admitting: Urology

## 2018-07-04 DIAGNOSIS — R3912 Poor urinary stream: Secondary | ICD-10-CM | POA: Diagnosis not present

## 2018-07-04 DIAGNOSIS — N401 Enlarged prostate with lower urinary tract symptoms: Secondary | ICD-10-CM | POA: Diagnosis not present

## 2018-07-04 DIAGNOSIS — C670 Malignant neoplasm of trigone of bladder: Secondary | ICD-10-CM | POA: Diagnosis not present

## 2018-07-04 DIAGNOSIS — R311 Benign essential microscopic hematuria: Secondary | ICD-10-CM

## 2018-07-07 ENCOUNTER — Other Ambulatory Visit: Payer: Self-pay | Admitting: Urology

## 2018-07-15 NOTE — Patient Instructions (Signed)
Miguel Hill  07/15/2018     @PREFPERIOPPHARMACY @   Your procedure is scheduled on  07/25/2018   Report to [name] at  21   A.M.  Call this number if you have problems the morning of surgery:  [phone]   Remember:  Do not eat or drink after midnight.                         Take these medicines the morning of surgery with A SIP OF WATER  None    Do not wear jewelry, make-up or nail polish.  Do not wear lotions, powders, or perfumes, or deodorant.  Do not shave 48 hours prior to surgery.  Men may shave face and neck.  Do not bring valuables to the hospital.  [facility] is not responsible for any belongings or valuables.  Contacts, dentures or bridgework may not be worn into surgery.  Leave your suitcase in the car.  After surgery it may be brought to your room.  For patients admitted to the hospital, discharge time will be determined by your treatment team.  Patients discharged the day of surgery will not be allowed to drive home.   Name and phone number of your driver:   family Special instructions:  None  Please read over the following fact sheets that you were given. Anesthesia Post-op Instructions and Care and Recovery After Surgery       Transurethral Resection of Bladder Tumor Transurethral resection of a bladder tumor is the removal (resection) of a cancerous growth (tumor) on the inside wall of the bladder. The bladder is the organ that holds urine. The tumor is removed through the tube that carries urine out of the body (urethra). In a transurethral resection, a thin telescope with a light, a tiny camera, and an electric cutting edge (resectoscope) is passed through the urethra. In men, the opening of the urethra is at the end of the penis. In women, it is just above the vaginal opening. Tell a health care provider about:  Any allergies you have.  All medicines you are taking, including vitamins, herbs, eye drops, creams, and  over-the-counter medicines.  Any problems you or family members have had with anesthetic medicines.  Any blood disorders you have.  Any surgeries you have had.  Any medical conditions you have.  Any recent urinary tract infections you have had.  Whether you are pregnant or may be pregnant. What are the risks? Generally, this is a safe procedure. However, problems may occur, including:  Infection.  Bleeding.  Allergic reactions to medicines.  Damage to other structures or organs, such as: ? The urethra. ? The tubes that drain urine from the kidneys into the bladder (ureters).  Pain and burning during urination.  Difficulty urinating due to partial blockage of the urethra.  Inability to urinate (urinary retention).  What happens before the procedure?  Follow instructions from your health care provider about eating and drinking restrictions.  Ask your health care provider about: ? Changing or stopping your regular medicines. This is especially important if you are taking diabetes medicines or blood thinners. ? Taking medicines such as aspirin and ibuprofen. These medicines can thin your blood. Do not take these medicines before your procedure if your health care provider instructs you not to.  You may have a physical exam.  You may have tests, including: ? Blood tests. ?  Urine tests. ? Electrocardiogram (ECG). This test measures the electrical activity of the heart.  You may be given antibiotic medicine to help prevent infection.  Ask your health care provider how your surgical site will be marked or identified.  Plan to have someone take you home after the procedure. What happens during the procedure?  To reduce your risk of infection: ? Your health care team will wash or sanitize their hands. ? Your skin will be washed with soap.  An IV tube will be inserted into one of your veins.  You will be given one or more of the following: ? A medicine to help you  relax (sedative). ? A medicine to make you fall asleep (general anesthetic). ? A medicine that is injected into your spine to numb the area below and slightly above the injection site (spinal anesthetic).  Your legs will be placed in foot rests (stirrups) so that your legs are apart and your knees are bent.  The resectoscope will be passed through your urethra and into your bladder.  The part of your bladder that is affected by the tumor will be resected using the cutting edge of the resectoscope.  The resectoscope will be removed.  A thin, flexible tube (catheter) will be passed through your urethra and into your bladder. The catheter will drain urine into a bag outside of your body. ? Fluid may be passed through the catheter to keep the catheter open. The procedure may vary among health care providers and hospitals. What happens after the procedure?  Your blood pressure, heart rate, breathing rate, and blood oxygen level will be monitored often until the medicines you were given have worn off.  You may continue to receive fluids and medicines through an IV tube.  You will have some pain. Pain medicine will be available to help you.  You will have a catheter draining your urine. ? You will have blood in your urine. Your catheter may be kept in until your urine is clear. ? Your urinary drainage will be monitored. If necessary, your bladder may be rinsed out (irrigated) by passing fluid through your catheter.  You will be encouraged to walk around as soon as possible.  You may have to wear compression stockings. These stockings help to prevent blood clots and reduce swelling in your legs.  Do not drive for 24 hours if you received a sedative. This information is not intended to replace advice given to you by your health care provider. Make sure you discuss any questions you have with your health care provider. Document Released: 06/02/2009 Document Revised: 04/08/2016 Document  Reviewed: 04/28/2015 Elsevier Interactive Patient Education  2018 [place].  Transurethral Resection of Bladder Tumor, Care After Refer to this sheet in the next few weeks. These instructions provide you with information about caring for yourself after your procedure. Your health care provider may also give you more specific instructions. Your treatment has been planned according to current medical practices, but problems sometimes occur. Call your health care provider if you have any problems or questions after your procedure. What can I expect after the procedure? After the procedure, it is common to have:  A small amount of blood in your urine for up to 2 weeks.  Soreness or mild discomfort from your catheter. After your catheter is removed, you may have mild soreness, especially when urinating.  Pain in your lower abdomen.  Follow these instructions at home:  Medicines  Take over-the-counter and prescription medicines only as  told by your health care provider.  Do not drive or operate heavy machinery while taking prescription pain medicine.  Do not drive for 24 hours if you received a sedative.  If you were prescribed antibiotic medicine, take it as told by your health care provider. Do not stop taking the antibiotic even if you start to feel better. Activity  Return to your normal activities as told by your health care provider. Ask your health care provider what activities are safe for you.  Do not lift anything that is heavier than 10 lb (4.5 kg) for as long as told by your health care provider.  Avoid intense physical activity for as long as told by your health care provider.  Walk at least one time every day. This helps to prevent blood clots. You may increase your physical activity gradually as you start to feel better. General instructions  Do not drink alcohol for as long as told by your health care provider. This is especially important if you are taking  prescription pain medicines.  Do not take baths, swim, or use a hot tub until your health care provider approves.  If you have a catheter, follow instructions from your health care provider about caring for your catheter and your drainage bag.  Drink enough fluid to keep your urine clear or pale yellow.  Wear compression stockings as told by your health care provider. These stockings help to prevent blood clots and reduce swelling in your legs.  Keep all follow-up visits as told by your health care provider. This is important. Contact a health care provider if:  You have pain that gets worse or does not improve with medicine.  You have blood in your urine for more than 2 weeks.  You have cloudy or bad-smelling urine.  You become constipated. Signs of constipation may include having: ? Fewer than three bowel movements in a week. ? Difficulty having a bowel movement. ? Stools that are dry, hard, or larger than normal.  You have a fever. Get help right away if:  You have: ? Severe pain. ? Bright-red blood in your urine. ? Blood clots in your urine. ? A lot of blood in your urine.  Your catheter has been removed and you are not able to urinate.  You have a catheter in place and the catheter is not draining urine. This information is not intended to replace advice given to you by your health care provider. Make sure you discuss any questions you have with your health care provider. Document Released: 07/18/2015 Document Revised: 04/08/2016 Document Reviewed: 04/28/2015 Elsevier Interactive Patient Education  2018 [employer].  Cystoscopy Cystoscopy is a procedure that is used to help diagnose and sometimes treat conditions that affect that lower urinary tract. The lower urinary tract includes the bladder and the tube that drains urine from the bladder out of the body (urethra). Cystoscopy is performed with a thin, tube-shaped instrument with a light and camera at the end  (cystoscope). The cystoscope may be hard (rigid) or flexible, depending on the goal of the procedure.The cystoscope is inserted through the urethra, into the bladder. Cystoscopy may be recommended if you have:  Urinary tractinfections that keep coming back (recurring).  Blood in the urine (hematuria).  Loss of bladder control (urinary incontinence) or an overactive bladder.  Unusual cells found in a urine sample.  A blockage in the urethra.  Painful urination.  An abnormality in the bladder found during an intravenous pyelogram (IVP) or CT scan.  Cystoscopy may also be done to remove a sample of tissue to be examined under a microscope (biopsy). Tell a health care provider about:  Any allergies you have.  All medicines you are taking, including vitamins, herbs, eye drops, creams, and over-the-counter medicines.  Any problems you or family members have had with anesthetic medicines.  Any blood disorders you have.  Any surgeries you have had.  Any medical conditions you have.  Whether you are pregnant or may be pregnant. What are the risks? Generally, this is a safe procedure. However, problems may occur, including:  Infection.  Bleeding.  Allergic reactions to medicines.  Damage to other structures or organs.  What happens before the procedure?  Ask your health care provider about: ? Changing or stopping your regular medicines. This is especially important if you are taking diabetes medicines or blood thinners. ? Taking medicines such as aspirin and ibuprofen. These medicines can thin your blood. Do not take these medicines before your procedure if your health care provider instructs you not to.  Follow instructions from your health care provider about eating or drinking restrictions.  You may be given antibiotic medicine to help prevent infection.  You may have an exam or testing, such as X-rays of the bladder, urethra, or kidneys.  You may have urine tests to  check for signs of infection.  Plan to have someone take you home after the procedure. What happens during the procedure?  To reduce your risk of infection,your health care team will wash or sanitize their hands.  You will be given one or more of the following: ? A medicine to help you relax (sedative). ? A medicine to numb the area (local anesthetic).  The area around the opening of your urethra will be cleaned.  The cystoscope will be passed through your urethra into your bladder.  Germ-free (sterile)fluid will flow through the cystoscope to fill your bladder. The fluid will stretch your bladder so that your surgeon can clearly examine your bladder walls.  The cystoscope will be removed and your bladder will be emptied. The procedure may vary among health care providers and hospitals. What happens after the procedure?  You may have some soreness or pain in your abdomen and urethra. Medicines will be available to help you.  You may have some blood in your urine.  Do not drive for 24 hours if you received a sedative. This information is not intended to replace advice given to you by your health care provider. Make sure you discuss any questions you have with your health care provider. Document Released: 08/03/2000 Document Revised: 12/15/2015 Document Reviewed: 06/23/2015 Elsevier Interactive Patient Education  2018 [employer].  Cystoscopy, Care After Refer to this sheet in the next few weeks. These instructions provide you with information about caring for yourself after your procedure. Your health care provider may also give you more specific instructions. Your treatment has been planned according to current medical practices, but problems sometimes occur. Call your health care provider if you have any problems or questions after your procedure. What can I expect after the procedure? After the procedure, it is common to have:  Mild pain when you urinate. Pain should stop  within a few minutes after you urinate. This may last for up to 1 week.  A small amount of blood in your urine for several days.  Feeling like you need to urinate but producing only a small amount of urine.  Follow these instructions at home:  Medicines  Take over-the-counter and prescription medicines only as told by your health care provider.  If you were prescribed an antibiotic medicine, take it as told by your health care provider. Do not stop taking the antibiotic even if you start to feel better. General instructions   Return to your normal activities as told by your health care provider. Ask your health care provider what activities are safe for you.  Do not drive for 24 hours if you received a sedative.  Watch for any blood in your urine. If the amount of blood in your urine increases, call your health care provider.  Follow instructions from your health care provider about eating or drinking restrictions.  If a tissue sample was removed for testing (biopsy) during your procedure, it is your responsibility to get your test results. Ask your health care provider or the department performing the test when your results will be ready.  Drink enough fluid to keep your urine clear or pale yellow.  Keep all follow-up visits as told by your health care provider. This is important. Contact a health care provider if:  You have pain that gets worse or does not get better with medicine, especially pain when you urinate.  You have difficulty urinating. Get help right away if:  You have more blood in your urine.  You have blood clots in your urine.  You have abdominal pain.  You have a fever or chills.  You are unable to urinate. This information is not intended to replace advice given to you by your health care provider. Make sure you discuss any questions you have with your health care provider. Document Released: 02/23/2005 Document Revised: 01/12/2016 Document Reviewed:  06/23/2015 Elsevier Interactive Patient Education  2018 [place] Anesthesia, Adult General anesthesia is the use of medicines to make a person "go to sleep" (be unconscious) for a medical procedure. General anesthesia is often recommended when a procedure:  Is long.  Requires you to be still or in an unusual position.  Is major and can cause you to lose blood.  Is impossible to do without general anesthesia.  The medicines used for general anesthesia are called general anesthetics. In addition to making you sleep, the medicines:  Prevent pain.  Control your blood pressure.  Relax your muscles.  Tell a health care provider about:  Any allergies you have.  All medicines you are taking, including vitamins, herbs, eye drops, creams, and over-the-counter medicines.  Any problems you or family members have had with anesthetic medicines.  Types of anesthetics you have had in the past.  Any bleeding disorders you have.  Any surgeries you have had.  Any medical conditions you have.  Any history of heart or lung conditions, such as heart failure, sleep apnea, or chronic obstructive pulmonary disease (COPD).  Whether you are pregnant or may be pregnant.  Whether you use tobacco, alcohol, marijuana, or street drugs.  Any history of Armed forces logistics/support/administrative officer.  Any history of depression or anxiety. What are the risks? Generally, this is a safe procedure. However, problems may occur, including:  Allergic reaction to anesthetics.  Lung and heart problems.  Inhaling food or liquids from your stomach into your lungs (aspiration).  Injury to nerves.  Waking up during your procedure and being unable to move (rare).  Extreme agitation or a state of mental confusion (delirium) when you wake up from the anesthetic.  Air in the bloodstream, which can lead to stroke.  These problems are more likely to  develop if you are having a major surgery or if you have an advanced  medical condition. You can prevent some of these complications by answering all of your health care provider's questions thoroughly and by following all pre-procedure instructions. General anesthesia can cause side effects, including:  Nausea or vomiting  A sore throat from the breathing tube.  Feeling cold or shivery.  Feeling tired, washed out, or achy.  Sleepiness or drowsiness.  Confusion or agitation.  What happens before the procedure? Staying hydrated Follow instructions from your health care provider about hydration, which may include:  Up to 2 hours before the procedure - you may continue to drink clear liquids, such as water, clear fruit juice, black coffee, and plain tea.  Eating and drinking restrictions Follow instructions from your health care provider about eating and drinking, which may include:  8 hours before the procedure - stop eating heavy meals or foods such as meat, fried foods, or fatty foods.  6 hours before the procedure - stop eating light meals or foods, such as toast or cereal.  6 hours before the procedure - stop drinking milk or drinks that contain milk.  2 hours before the procedure - stop drinking clear liquids.  Medicines  Ask your health care provider about: ? Changing or stopping your regular medicines. This is especially important if you are taking diabetes medicines or blood thinners. ? Taking medicines such as aspirin and ibuprofen. These medicines can thin your blood. Do not take these medicines before your procedure if your health care provider instructs you not to. ? Taking new dietary supplements or medicines. Do not take these during the week before your procedure unless your health care provider approves them.  If you are told to take a medicine or to continue taking a medicine on the day of the procedure, take the medicine with sips of water. General instructions   Ask if you will be going home the same day, the following day,  or after a longer hospital stay. ? Plan to have someone take you home. ? Plan to have someone stay with you for the first 24 hours after you leave the hospital or clinic.  For 3-6 weeks before the procedure, try not to use any tobacco products, such as cigarettes, chewing tobacco, and e-cigarettes.  You may brush your teeth on the morning of the procedure, but make sure to spit out the toothpaste. What happens during the procedure?  You will be given anesthetics through a mask and through an IV tube in one of your veins.  You may receive medicine to help you relax (sedative).  As soon as you are asleep, a breathing tube may be used to help you breathe.  An anesthesia specialist will stay with you throughout the procedure. He or she will help keep you comfortable and safe by continuing to give you medicines and adjusting the amount of medicine that you get. He or she will also watch your blood pressure, pulse, and oxygen levels to make sure that the anesthetics do not cause any problems.  If a breathing tube was used to help you breathe, it will be removed before you wake up. The procedure may vary among health care providers and hospitals. What happens after the procedure?  You will wake up, often slowly, after the procedure is complete, usually in a recovery area.  Your blood pressure, heart rate, breathing rate, and blood oxygen level will be monitored until the medicines you were given have worn  off.  You may be given medicine to help you calm down if you feel anxious or agitated.  If you will be going home the same day, your health care provider may check to make sure you can stand, drink, and urinate.  Your health care providers will treat your pain and side effects before you go home.  Do not drive for 24 hours if you received a sedative.  You may: ? Feel nauseous and vomit. ? Have a sore throat. ? Have mental slowness. ? Feel cold or shivery. ? Feel sleepy. ? Feel  tired. ? Feel sore or achy, even in parts of your body where you did not have surgery. This information is not intended to replace advice given to you by your health care provider. Make sure you discuss any questions you have with your health care provider. Document Released: 11/13/2007 Document Revised: 01/17/2016 Document Reviewed: 07/21/2015 Elsevier Interactive Patient Education  2018 [place] Anesthesia, Adult, Care After These instructions provide you with information about caring for yourself after your procedure. Your health care provider may also give you more specific instructions. Your treatment has been planned according to current medical practices, but problems sometimes occur. Call your health care provider if you have any problems or questions after your procedure. What can I expect after the procedure? After the procedure, it is common to have:  Vomiting.  A sore throat.  Mental slowness.  It is common to feel:  Nauseous.  Cold or shivery.  Sleepy.  Tired.  Sore or achy, even in parts of your body where you did not have surgery.  Follow these instructions at home: For at least 24 hours after the procedure:  Do not: ? Participate in activities where you could fall or become injured. ? Drive. ? Use heavy machinery. ? Drink alcohol. ? Take sleeping pills or medicines that cause drowsiness. ? Make important decisions or sign legal documents. ? Take care of children on your own.  Rest. Eating and drinking  If you vomit, drink water, juice, or soup when you can drink without vomiting.  Drink enough fluid to keep your urine clear or pale yellow.  Make sure you have little or no nausea before eating solid foods.  Follow the diet recommended by your health care provider. General instructions  Have a responsible adult stay with you until you are awake and alert.  Return to your normal activities as told by your health care provider. Ask your  health care provider what activities are safe for you.  Take over-the-counter and prescription medicines only as told by your health care provider.  If you smoke, do not smoke without supervision.  Keep all follow-up visits as told by your health care provider. This is important. Contact a health care provider if:  You continue to have nausea or vomiting at home, and medicines are not helpful.  You cannot drink fluids or start eating again.  You cannot urinate after 8-12 hours.  You develop a skin rash.  You have fever.  You have increasing redness at the site of your procedure. Get help right away if:  You have difficulty breathing.  You have chest pain.  You have unexpected bleeding.  You feel that you are having a life-threatening or urgent problem. This information is not intended to replace advice given to you by your health care provider. Make sure you discuss any questions you have with your health care provider. Document Released: 11/12/2000 Document Revised: 01/09/2016 Document Reviewed:  07/21/2015 Elsevier Interactive Patient Education  [employer].

## 2018-07-23 ENCOUNTER — Other Ambulatory Visit: Payer: Self-pay

## 2018-07-23 ENCOUNTER — Encounter (HOSPITAL_COMMUNITY): Payer: Self-pay

## 2018-07-23 ENCOUNTER — Encounter (HOSPITAL_COMMUNITY)
Admission: RE | Admit: 2018-07-23 | Discharge: 2018-07-23 | Disposition: A | Discharge status: Home / Self Care | Payer: Medicare Other | Source: Ambulatory Visit | Attending: Urology | Admitting: Urology

## 2018-07-23 DIAGNOSIS — Z01818 Encounter for other preprocedural examination: Secondary | ICD-10-CM | POA: Insufficient documentation

## 2018-07-23 DIAGNOSIS — C679 Malignant neoplasm of bladder, unspecified: Secondary | ICD-10-CM | POA: Diagnosis present

## 2018-07-23 DIAGNOSIS — C670 Malignant neoplasm of trigone of bladder: Secondary | ICD-10-CM | POA: Insufficient documentation

## 2018-07-23 DIAGNOSIS — Z87891 Personal history of nicotine dependence: Secondary | ICD-10-CM | POA: Diagnosis not present

## 2018-07-23 DIAGNOSIS — R350 Frequency of micturition: Secondary | ICD-10-CM | POA: Insufficient documentation

## 2018-07-23 DIAGNOSIS — R9431 Abnormal electrocardiogram [ECG] [EKG]: Secondary | ICD-10-CM

## 2018-07-23 DIAGNOSIS — R3912 Poor urinary stream: Secondary | ICD-10-CM | POA: Diagnosis not present

## 2018-07-23 DIAGNOSIS — R7303 Prediabetes: Secondary | ICD-10-CM | POA: Diagnosis not present

## 2018-07-23 DIAGNOSIS — R311 Benign essential microscopic hematuria: Secondary | ICD-10-CM

## 2018-07-23 DIAGNOSIS — R339 Retention of urine, unspecified: Secondary | ICD-10-CM

## 2018-07-23 DIAGNOSIS — R3915 Urgency of urination: Secondary | ICD-10-CM | POA: Insufficient documentation

## 2018-07-23 DIAGNOSIS — R3129 Other microscopic hematuria: Secondary | ICD-10-CM | POA: Diagnosis not present

## 2018-07-23 DIAGNOSIS — N401 Enlarged prostate with lower urinary tract symptoms: Secondary | ICD-10-CM | POA: Diagnosis not present

## 2018-07-23 DIAGNOSIS — Z8249 Family history of ischemic heart disease and other diseases of the circulatory system: Secondary | ICD-10-CM | POA: Diagnosis not present

## 2018-07-23 DIAGNOSIS — Z79899 Other long term (current) drug therapy: Secondary | ICD-10-CM | POA: Diagnosis not present

## 2018-07-23 DIAGNOSIS — I10 Essential (primary) hypertension: Secondary | ICD-10-CM | POA: Diagnosis not present

## 2018-07-23 DIAGNOSIS — C672 Malignant neoplasm of lateral wall of bladder: Secondary | ICD-10-CM | POA: Diagnosis not present

## 2018-07-23 HISTORY — DX: Nausea with vomiting, unspecified: R11.2

## 2018-07-23 HISTORY — DX: Other specified postprocedural states: Z98.890

## 2018-07-23 HISTORY — DX: Essential (primary) hypertension: I10

## 2018-07-23 LAB — CBC WITH DIFFERENTIAL/PLATELET
Abs Immature Granulocytes: 0.01 10*3/uL (ref 0.00–0.07)
Basophils Absolute: 0 10*3/uL (ref 0.0–0.1)
Basophils Relative: 0 %
EOS ABS: 0 10*3/uL (ref 0.0–0.5)
Eosinophils Relative: 1 %
HCT: 44 % (ref 39.0–52.0)
Hemoglobin: 14.5 g/dL (ref 13.0–17.0)
Immature Granulocytes: 0 %
Lymphocytes Relative: 29 %
Lymphs Abs: 1.4 10*3/uL (ref 0.7–4.0)
MCH: 30 pg (ref 26.0–34.0)
MCHC: 33 g/dL (ref 30.0–36.0)
MCV: 91.1 fL (ref 80.0–100.0)
Monocytes Absolute: 0.4 10*3/uL (ref 0.1–1.0)
Monocytes Relative: 9 %
NRBC: 0 % (ref 0.0–0.2)
Neutro Abs: 2.9 10*3/uL (ref 1.7–7.7)
Neutrophils Relative %: 61 %
Platelets: 155 10*3/uL (ref 150–400)
RBC: 4.83 MIL/uL (ref 4.22–5.81)
RDW: 12.9 % (ref 11.5–15.5)
WBC: 4.8 10*3/uL (ref 4.0–10.5)

## 2018-07-23 LAB — BASIC METABOLIC PANEL
Anion gap: 7 (ref 5–15)
BUN: 17 mg/dL (ref 8–23)
CO2: 26 mmol/L (ref 22–32)
Calcium: 9 mg/dL (ref 8.9–10.3)
Chloride: 106 mmol/L (ref 98–111)
Creatinine, Ser: 1.01 mg/dL (ref 0.61–1.24)
GFR calc Af Amer: >60 mL/min (ref >60)
GFR calc non Af Amer: >60 mL/min (ref >60)
GLUCOSE: 142 mg/dL — AB (ref 70–99)
Potassium: 3.7 mmol/L (ref 3.5–5.1)
Sodium: 139 mmol/L (ref 135–145)

## 2018-07-23 NOTE — Pre-Procedure Instructions (Signed)
Patient in for PAT. He had many questions about his procedure that I was unable to answer, including risks and benefits of his procedure. I was unable to reach anything but answering machine at [facility]. The patient and I talked at length about procedure and we both feel that he needs to have his questions answered before he signs his consent. Will let him speak with Dr Jeffie Pollock on the morning of surgery and then let him sign his consent. Patient is in full agreement with this.

## 2018-07-25 ENCOUNTER — Encounter (HOSPITAL_COMMUNITY): Payer: Self-pay | Admitting: *Deleted

## 2018-07-25 ENCOUNTER — Ambulatory Visit (HOSPITAL_COMMUNITY): Payer: Medicare Other | Admitting: Anesthesiology

## 2018-07-25 ENCOUNTER — Encounter (HOSPITAL_COMMUNITY)
Admission: RE | Disposition: A | Discharge status: Home / Self Care | Payer: Self-pay | Source: Ambulatory Visit | Attending: Urology

## 2018-07-25 ENCOUNTER — Other Ambulatory Visit: Payer: Self-pay

## 2018-07-25 ENCOUNTER — Observation Stay (HOSPITAL_COMMUNITY)
Admission: RE | Admit: 2018-07-25 | Discharge: 2018-07-26 | Disposition: A | Discharge status: Home / Self Care | Payer: Medicare Other | Source: Ambulatory Visit | Attending: Urology | Admitting: Urology

## 2018-07-25 DIAGNOSIS — R7303 Prediabetes: Secondary | ICD-10-CM | POA: Insufficient documentation

## 2018-07-25 DIAGNOSIS — R3129 Other microscopic hematuria: Secondary | ICD-10-CM | POA: Insufficient documentation

## 2018-07-25 DIAGNOSIS — R3912 Poor urinary stream: Secondary | ICD-10-CM | POA: Diagnosis not present

## 2018-07-25 DIAGNOSIS — D494 Neoplasm of unspecified behavior of bladder: Secondary | ICD-10-CM

## 2018-07-25 DIAGNOSIS — C679 Malignant neoplasm of bladder, unspecified: Secondary | ICD-10-CM | POA: Diagnosis present

## 2018-07-25 DIAGNOSIS — C672 Malignant neoplasm of lateral wall of bladder: Secondary | ICD-10-CM | POA: Diagnosis not present

## 2018-07-25 DIAGNOSIS — Z8249 Family history of ischemic heart disease and other diseases of the circulatory system: Secondary | ICD-10-CM | POA: Insufficient documentation

## 2018-07-25 DIAGNOSIS — I10 Essential (primary) hypertension: Secondary | ICD-10-CM | POA: Insufficient documentation

## 2018-07-25 DIAGNOSIS — Z79899 Other long term (current) drug therapy: Secondary | ICD-10-CM | POA: Insufficient documentation

## 2018-07-25 DIAGNOSIS — Z87891 Personal history of nicotine dependence: Secondary | ICD-10-CM | POA: Diagnosis not present

## 2018-07-25 DIAGNOSIS — N401 Enlarged prostate with lower urinary tract symptoms: Secondary | ICD-10-CM | POA: Insufficient documentation

## 2018-07-25 DIAGNOSIS — C678 Malignant neoplasm of overlapping sites of bladder: Secondary | ICD-10-CM

## 2018-07-25 HISTORY — PX: TRANSURETHRAL RESECTION OF BLADDER TUMOR WITH MITOMYCIN-C: SHX6459

## 2018-07-25 LAB — GLUCOSE, CAPILLARY: Glucose-Capillary: 87 mg/dL (ref 70–99)

## 2018-07-25 SURGERY — TRANSURETHRAL RESECTION OF BLADDER TUMOR WITH MITOMYCIN-C
Anesthesia: General | Site: Bladder

## 2018-07-25 MED ORDER — DEXAMETHASONE SODIUM PHOSPHATE 4 MG/ML IJ SOLN
INTRAMUSCULAR | Status: DC | PRN
  Administered 2018-07-25: 4 mg via INTRAVENOUS

## 2018-07-25 MED ORDER — SODIUM CHLORIDE 0.9 % IR SOLN
Status: DC | PRN
  Administered 2018-07-25 (×2): 3000 mL

## 2018-07-25 MED ORDER — FENTANYL CITRATE (PF) 100 MCG/2ML IJ SOLN
INTRAMUSCULAR | Status: DC | PRN
  Administered 2018-07-25 (×2): 50 ug via INTRAVENOUS

## 2018-07-25 MED ORDER — HYDROMORPHONE HCL 1 MG/ML IJ SOLN
0.5000 mg | INTRAMUSCULAR | Status: DC | PRN
  Administered 2018-07-25 – 2018-07-26 (×2): 1 mg via INTRAVENOUS
  Filled 2018-07-25 (×2): qty 1

## 2018-07-25 MED ORDER — FENTANYL CITRATE (PF) 100 MCG/2ML IJ SOLN
INTRAMUSCULAR | Status: AC
  Filled 2018-07-25: qty 2

## 2018-07-25 MED ORDER — ZOLPIDEM TARTRATE 5 MG PO TABS: 5.0000 mg | ORAL_TABLET | Freq: Every evening | ORAL | Status: DC | PRN

## 2018-07-25 MED ORDER — ONDANSETRON HCL 4 MG/2ML IJ SOLN
INTRAMUSCULAR | Status: AC
  Filled 2018-07-25: qty 2

## 2018-07-25 MED ORDER — PROMETHAZINE HCL 25 MG/ML IJ SOLN: 6.2500 mg | INTRAMUSCULAR | Status: DC | PRN

## 2018-07-25 MED ORDER — PROPOFOL 10 MG/ML IV BOLUS
INTRAVENOUS | Status: DC | PRN
  Administered 2018-07-25: 150 mg via INTRAVENOUS
  Administered 2018-07-25: 50 mg via INTRAVENOUS

## 2018-07-25 MED ORDER — HYDRALAZINE HCL 20 MG/ML IJ SOLN
INTRAMUSCULAR | Status: AC
  Filled 2018-07-25: qty 1

## 2018-07-25 MED ORDER — DIPHENHYDRAMINE HCL 12.5 MG/5ML PO ELIX: 12.5000 mg | ORAL_SOLUTION | Freq: Four times a day (QID) | ORAL | Status: DC | PRN

## 2018-07-25 MED ORDER — GEMCITABINE CHEMO FOR BLADDER INSTILLATION 2000 MG
INTRAVENOUS | Status: DC | PRN
  Administered 2018-07-25: 2000 mg via INTRAVESICAL

## 2018-07-25 MED ORDER — GEMCITABINE CHEMO FOR BLADDER INSTILLATION 2000 MG
2000.0000 mg | Freq: Once | INTRAVENOUS | Status: DC
  Filled 2018-07-25 (×2): qty 52.6

## 2018-07-25 MED ORDER — LABETALOL HCL 5 MG/ML IV SOLN
INTRAVENOUS | Status: AC
  Filled 2018-07-25: qty 4

## 2018-07-25 MED ORDER — ONDANSETRON HCL 4 MG/2ML IJ SOLN
INTRAMUSCULAR | Status: DC | PRN
  Administered 2018-07-25: 4 mg via INTRAVENOUS

## 2018-07-25 MED ORDER — HYDRALAZINE HCL 20 MG/ML IJ SOLN
5.0000 mg | Freq: Once | INTRAMUSCULAR | Status: AC
  Administered 2018-07-25: 5 mg via INTRAVENOUS

## 2018-07-25 MED ORDER — SENNOSIDES-DOCUSATE SODIUM 8.6-50 MG PO TABS: 1.0000 | ORAL_TABLET | Freq: Every evening | ORAL | Status: DC | PRN

## 2018-07-25 MED ORDER — HYDROCODONE-ACETAMINOPHEN 7.5-325 MG PO TABS: 1.0000 | ORAL_TABLET | Freq: Four times a day (QID) | ORAL | 0 refills | Status: DC | PRN

## 2018-07-25 MED ORDER — MEPERIDINE HCL 50 MG/ML IJ SOLN: 6.2500 mg | INTRAMUSCULAR | Status: DC | PRN

## 2018-07-25 MED ORDER — LACTATED RINGERS IV SOLN
INTRAVENOUS | Status: DC
  Administered 2018-07-25: 12:00:00 via INTRAVENOUS

## 2018-07-25 MED ORDER — LABETALOL HCL 5 MG/ML IV SOLN
10.0000 mg | Freq: Once | INTRAVENOUS | Status: AC
  Administered 2018-07-25: 10 mg via INTRAVENOUS

## 2018-07-25 MED ORDER — ACETAMINOPHEN 325 MG PO TABS: 650.0000 mg | ORAL_TABLET | ORAL | Status: DC | PRN

## 2018-07-25 MED ORDER — ONDANSETRON HCL 4 MG/2ML IJ SOLN
4.0000 mg | INTRAMUSCULAR | Status: DC | PRN
  Administered 2018-07-25 – 2018-07-26 (×3): 4 mg via INTRAVENOUS
  Filled 2018-07-25 (×4): qty 2

## 2018-07-25 MED ORDER — PROPOFOL 10 MG/ML IV BOLUS
INTRAVENOUS | Status: AC
  Filled 2018-07-25: qty 40

## 2018-07-25 MED ORDER — CEFAZOLIN SODIUM-DEXTROSE 2-4 GM/100ML-% IV SOLN
INTRAVENOUS | Status: AC
  Filled 2018-07-25: qty 100

## 2018-07-25 MED ORDER — HYDROMORPHONE HCL 1 MG/ML IJ SOLN
0.2500 mg | INTRAMUSCULAR | Status: DC | PRN
  Administered 2018-07-25 (×4): 0.5 mg via INTRAVENOUS
  Filled 2018-07-25 (×4): qty 0.5

## 2018-07-25 MED ORDER — DIPHENHYDRAMINE HCL 50 MG/ML IJ SOLN: 12.5000 mg | Freq: Four times a day (QID) | INTRAMUSCULAR | Status: DC | PRN

## 2018-07-25 MED ORDER — HYDROCODONE-ACETAMINOPHEN 5-325 MG PO TABS: 1.0000 | ORAL_TABLET | Freq: Four times a day (QID) | ORAL | Status: DC | PRN

## 2018-07-25 MED ORDER — CEFAZOLIN SODIUM-DEXTROSE 2-4 GM/100ML-% IV SOLN
2.0000 g | INTRAVENOUS | Status: AC
  Administered 2018-07-25: 2 g via INTRAVENOUS

## 2018-07-25 MED ORDER — HYOSCYAMINE SULFATE 0.125 MG SL SUBL: 0.1250 mg | SUBLINGUAL_TABLET | SUBLINGUAL | Status: DC | PRN

## 2018-07-25 MED ORDER — LACTATED RINGERS IV SOLN
INTRAVENOUS | Status: DC
  Administered 2018-07-25: 14:00:00 via INTRAVENOUS

## 2018-07-25 MED ORDER — SEVOFLURANE IN SOLN
RESPIRATORY_TRACT | Status: AC
  Filled 2018-07-25: qty 250

## 2018-07-25 MED ORDER — DEXAMETHASONE SODIUM PHOSPHATE 10 MG/ML IJ SOLN
INTRAMUSCULAR | Status: AC
  Filled 2018-07-25: qty 1

## 2018-07-25 MED ORDER — HYDROCODONE-ACETAMINOPHEN 7.5-325 MG PO TABS: 1.0000 | ORAL_TABLET | Freq: Once | ORAL | Status: DC | PRN

## 2018-07-25 MED ORDER — ARTIFICIAL TEARS OPHTHALMIC OINT
TOPICAL_OINTMENT | OPHTHALMIC | Status: AC
  Filled 2018-07-25: qty 3.5

## 2018-07-25 MED ORDER — BISACODYL 10 MG RE SUPP: 10.0000 mg | Freq: Every day | RECTAL | Status: DC | PRN

## 2018-07-25 MED ORDER — STERILE WATER FOR IRRIGATION IR SOLN
Status: DC | PRN
  Administered 2018-07-25: 1000 mL

## 2018-07-25 MED ORDER — POTASSIUM CHLORIDE IN NACL 20-0.45 MEQ/L-% IV SOLN
INTRAVENOUS | Status: DC
  Administered 2018-07-25 – 2018-07-26 (×2): via INTRAVENOUS
  Filled 2018-07-25 (×7): qty 1000

## 2018-07-25 MED ORDER — FLEET ENEMA 7-19 GM/118ML RE ENEM: 1.0000 | ENEMA | Freq: Once | RECTAL | Status: DC | PRN

## 2018-07-25 SURGICAL SUPPLY — 24 items
BAG DRAIN URO TABLE W/ADPT NS (BAG) ×2 IMPLANT
BAG DRN 8 ADPR NS SKTRN CSTL (BAG) ×1
BAG URINE DRAINAGE (UROLOGICAL SUPPLIES) ×2 IMPLANT
CABLE HI FREQUENCY MONOPOLAR (ELECTROSURGICAL) ×2 IMPLANT
CATH FOLEY 2WAY SLVR  5CC 20FR (CATHETERS) ×1
CATH FOLEY 2WAY SLVR 5CC 20FR (CATHETERS) IMPLANT
CLOTH BEACON ORANGE TIMEOUT ST (SAFETY) ×2 IMPLANT
ELECT LOOP 22F BIPOLAR SML (ELECTROSURGICAL) ×2
ELECT REM PT RETURN 9FT ADLT (ELECTROSURGICAL) ×2
ELECTRODE LOOP 22F BIPOLAR SML (ELECTROSURGICAL) IMPLANT
ELECTRODE REM PT RTRN 9FT ADLT (ELECTROSURGICAL) ×1 IMPLANT
GLOVE BIO SURGEON STRL SZ7 (GLOVE) ×1 IMPLANT
GLOVE BIOGEL PI IND STRL 7.0 (GLOVE) IMPLANT
GLOVE BIOGEL PI INDICATOR 7.0 (GLOVE) ×2
GLOVE SURG SS PI 8.0 STRL IVOR (GLOVE) ×2 IMPLANT
GOWN STRL REUS W/TWL LRG LVL3 (GOWN DISPOSABLE) ×2 IMPLANT
IV NS IRRIG 3000ML ARTHROMATIC (IV SOLUTION) ×3 IMPLANT
KIT TURNOVER CYSTO (KITS) ×2 IMPLANT
MANIFOLD NEPTUNE II (INSTRUMENTS) ×2 IMPLANT
PACK CYSTO (CUSTOM PROCEDURE TRAY) ×2 IMPLANT
PAD ARMBOARD 7.5X6 YLW CONV (MISCELLANEOUS) ×2 IMPLANT
SYR 30ML LL (SYRINGE) ×2 IMPLANT
SYRINGE IRR TOOMEY STRL 70CC (SYRINGE) ×2 IMPLANT
TOWEL OR 17X26 4PK STRL BLUE (TOWEL DISPOSABLE) ×4 IMPLANT

## 2018-07-25 NOTE — Transfer of Care (Signed)
Immediate Anesthesia Transfer of Care Note  Patient: Miguel Hill  Procedure(s) Performed: CYSTOSCOPY TRANSURETHRAL RESECTION OF BLADDER TUMOR WITH GEMCITABINE INSTILLATION (N/A Bladder)  Patient Location: PACU  Anesthesia Type:General  Level of Consciousness: awake, alert , oriented and patient cooperative  Airway & Oxygen Therapy: Patient Spontanous Breathing  Post-op Assessment: Report given to RN and Post -op Vital signs reviewed and stable  Post vital signs: Reviewed and stable  Last Vitals:  Vitals Value Taken Time  BP    Temp    Pulse 84 07/25/2018  1:19 PM  Resp 11 07/25/2018  1:19 PM  SpO2 94 % 07/25/2018  1:19 PM  Vitals shown include unvalidated device data.  Last Pain: There were no vitals filed for this visit.       Complications: No apparent anesthesia complications

## 2018-07-25 NOTE — Progress Notes (Signed)
Patient ID: Miguel Hill, male   DOB: 03-Jul-1933, 84 y.o.   MRN: [id number]  He is doing well post op.   Urine is light pink.  BP (!) 154/80   Pulse 71   Temp 98.2 F (36.8 C)   Resp 15   SpO2 95% .  Plan for D/C in the AM after a voiding trial.   Orders for discharge in.

## 2018-07-25 NOTE — Anesthesia Preprocedure Evaluation (Signed)
Anesthesia Evaluation    History of Anesthesia Complications (+) PONV and history of anesthetic complications  Airway Mallampati: III       Dental  (+) Teeth Intact   Pulmonary former smoker,    breath sounds clear to auscultation       Cardiovascular hypertension, On Medications  Rhythm:regular     Neuro/Psych    GI/Hepatic   Endo/Other    Renal/GU      Musculoskeletal   Abdominal   Peds  Hematology   Anesthesia Other Findings NSR with nonspecific T changes at 74  Reproductive/Obstetrics                             Anesthesia Physical Anesthesia Plan  ASA: III  Anesthesia Plan: General   Post-op Pain Management:    Induction:   PONV Risk Score and Plan:   Airway Management Planned:   Additional Equipment:   Intra-op Plan:   Post-operative Plan:   Informed Consent:   Plan Discussed with: Anesthesiologist  Anesthesia Plan Comments:         Anesthesia Quick Evaluation

## 2018-07-25 NOTE — Op Note (Signed)
Procedure: Cystoscopy with transurethral resection of the right lateral wall 5 cm bladder tumor.  Instillation of gemcitabine in PACU.  Preop diagnosis.  Bladder tumor lateral to the left ureteral orifice.  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  Surgeon: Dr. Irine Seal.  Anesthesia: General.  Specimen: Bladder tumor chips.  Drains: 20 French Foley catheter.  EBL: Minimal.  Complications: None.  Indications: Miguel Hill is an 84 year old white male who was found on evaluation for hematuria to have a tumor lateral to the left ureteral orifice on office cystoscopy.  The tumor appeared to measure approximately 1 cm.  Procedure: He was taken the operating room where he was given 2 g of Ancef.  A general anesthetic was induced.  He was placed in the lithotomy position and fitted with PSAs.  His perineum and genitalia were prepped with Betadine solution and he was draped in usual sterile fashion.  Cystoscopy was performed using the 23 [place] scope and 30 and 70 degree lenses.  Examination revealed a normal urethra.  The external sphincter was intact.  The prostatic urethra was approximately 3 cm in length with bilobar hyperplasia with mild obstruction.  Examination of the bladder demonstrated moderate trabeculation.  The ureteral orifice ease were in the normal anatomic position.  Just lateral to the left ureteral orifice was a papillary bladder tumor which appeared to be approximately 1-1/2 cm in diameter; however there were mucosal changes consistent with carcinoma in situ extending approximately 4 to 5 cm lateral to the primary lesion in addition to mucosal changes toward the bladder neck and around the ureteral orifice.  After initial cystoscopy, a 26 French continuous-flow resectoscope sheath was placed with the aid of the visual obturator.  It was then fitted with an Beatrix Fetters handle with a 30 degree lens and a bipolar loop.  Saline was used as the  irrigant.  The papillary lesion lateral to the orifice was resected with care taken to avoid injury to the ureteral orifice.  Additional resection of some of the flat tumor was also performed and then the remaining abnormal mucosa was generously fulgurated.  The final diameter of the lesion was approximately 4 x 5 cm.  It did appear that muscle had been obtained in the specimen.  The bladder was evacuated free of chips and final hemostasis was achieved.  No evidence of bladder wall perforation was noted.  The cystoscope was removed and a 20 French 10 cc Foley catheter was inserted without difficulty.  The balloon was inflated and the catheter was placed to straight drainage.  The drainage was clear.  He was taken down from lithotomy position, his anesthetic was reversed and he was moved recovery room in stable condition.  In the recovery room his bladder was instilled with 2000 mg of gemcitabine in 50 mL of saline.  This was left indwelling for 1 hour and then the bladder was drained.  There were no complications.

## 2018-07-25 NOTE — Discharge Instructions (Addendum)

## 2018-07-25 NOTE — Anesthesia Postprocedure Evaluation (Signed)
Anesthesia Post Note  Patient: Miguel Hill  Procedure(s) Performed: CYSTOSCOPY TRANSURETHRAL RESECTION OF BLADDER TUMOR WITH GEMCITABINE INSTILLATION (N/A Bladder)  Patient location during evaluation: PACU Anesthesia Type: General Level of consciousness: awake and alert and oriented Pain management: pain level controlled Vital Signs Assessment: post-procedure vital signs reviewed and stable Respiratory status: spontaneous breathing Cardiovascular status: stable Postop Assessment: no apparent nausea or vomiting Anesthetic complications: no     Last Vitals:  Vitals:   07/25/18 1130  Temp: 36.5 C    Last Pain: There were no vitals filed for this visit.               Amulya Quintin A

## 2018-07-25 NOTE — OR Nursing (Signed)
Dr. Jeffie Pollock in room with patient to discuss any questions or concerns.

## 2018-07-25 NOTE — H&P (Signed)
CC/HPI: I have blood in my urine.     Miguel Hill returns today for cystoscopy for further evaluatioin of hematuria and voiding symptoms. He had a CT that showed a small bladder lesions. He continues to have moderate LUTs with an IPSS of 13. UA has 2+ blood today.   GU hx: Miguel Hill is sent in consultation by Dr. Wendi Snipes for microhematuria with a PSA in 8/18 of 7.2 and a UTI with enterococcus in 12/18. He has BPH with BOO and an IPSS of 12. He has some frequency and urgency with nocturia x 2 and an occasional sensation of incomplete emptying. He was seen in the ER in [place] and was diagnosed with prostatitis and was given Cipro but he didn't start it because he was concerned about side effects. He didn't have any dysuria with that. He didn't get an antibiotic in the ER. He was seen because of a headache and a fever to 102. His UA there had 4-6 RBC with 2+ bact but was nit - with no WBC's. A Chest CT showed no pneumonia and a Head CT was negative. The fever has resolved. His PSA in 2016 was up to 6 but then dropped to 3, but I don't have any indication as to why. He has had occasional UTI's in the past. He has had no stones. He has had no GU surgery. His UA today has Moderate blood. He has never had gross hematuria.    ALLERGIES: None    Notes: Pt. states hypersensitive to any meds. or vitamin supplement , even herbal and some foods.   MEDICATIONS: None   GU PSH: Locm 300-399Mg /Ml Iodine,1Ml - 05/22/2018    NON-GU PSH: Hernia Repair    GU PMH: BPH w/LUTS, I will assess his LUTS with a flowrate and PVR at f/u for cystoscopy. - 03/14/2018 Elevated PSA, His PSA was 7.2 about a year ago. I will repeat that prior to f/u. - 03/14/2018 Microscopic hematuria, He has microhematuria and a history of UTI's. I will get a UA micro and culture today and treat if positive but try to avoid Cipro per patient request. I will get him set up for a CT hematuria study and possible cystoscopy. - 03/14/2018 Personal Hx  Urinary Tract Infections, He had enterococcus on culture in 12/18. - 03/14/2018 Urinary Frequency - 03/14/2018    NON-GU PMH: Acute gastric ulcer with hemorrhage Arthritis    FAMILY HISTORY: Heart Attack - Father   SOCIAL HISTORY: Marital Status: Divorced Preferred Language: English; Ethnicity: Not Hispanic Or Latino; Race: White Current Smoking Status: Patient does not smoke anymore. Has not smoked since 02/18/1988. Smoked for 35 years. Smoked 1/2 pack per day.   Tobacco Use Assessment Completed: Used Tobacco in last 30 days? Has never drank.  Drinks 1 caffeinated drink per day. Patient's occupation is/was retired.    REVIEW OF SYSTEMS:    GU Review Male:   Patient denies frequent urination, hard to postpone urination, burning/ pain with urination, get up at night to urinate, leakage of urine, stream starts and stops, trouble starting your stream, have to strain to urinate , erection problems, and penile pain.  Gastrointestinal (Upper):   Patient denies nausea, vomiting, and indigestion/ heartburn.  Gastrointestinal (Lower):   Patient denies diarrhea and constipation.  Constitutional:   Patient denies fever, night sweats, weight loss, and fatigue.  Skin:   Patient denies skin rash/ lesion and itching.  Eyes:   Patient denies blurred vision and double vision.  Ears/ Nose/ Throat:  Patient denies sore throat and sinus problems.  Hematologic/Lymphatic:   Patient denies swollen glands and easy bruising.  Cardiovascular:   Patient denies leg swelling and chest pains.  Respiratory:   Patient denies cough and shortness of breath.  Endocrine:   Patient denies excessive thirst.  Musculoskeletal:   Patient denies back pain and joint pain.  Neurological:   Patient denies headaches and dizziness.  Psychologic:   Patient denies depression and anxiety.   VITAL SIGNS:      07/04/2018 10:16 AM  Weight 148 lb / 67.13 kg  Height 70 in / 177.8 cm  BP 194/98 mmHg  Pulse 65 /min  Temperature 98.2  F / 36.7 C  BMI 21.2 kg/m   MULTI-SYSTEM PHYSICAL EXAMINATION:    Constitutional: Well-nourished. No physical deformities. Normally developed. Good grooming.  Respiratory: Normal breath sounds. No labored breathing, no use of accessory muscles.   Cardiovascular: Regular rate and rhythm. No murmur, no gallop.      PAST DATA REVIEWED:  Source Of History:  Patient  Urine Test Review:   Urinalysis  Urodynamics Review:   Review Bladder Scan, Review Flow Rate  X-Ray Review: C.T. Hematuria: Reviewed Films. Reviewed Report. Discussed With Patient.     PROCEDURES:         Flexible Cystoscopy - 52000  Risks, benefits, and some of the potential complications of the procedure were discussed. 6ml of 2% lidocaine jelly was instilled intraurethrally.     Meatus:  Normal size. Normal location. Normal condition.  Urethra:  No strictures.  External Sphincter:  Normal.  Verumontanum:  Normal.  Prostate:  Mild hyperplasia. Non-obstructing.  Bladder Neck:  Non-obstructing.  Ureteral Orifices:  Normal location. Normal size. Normal shape. Effluxed clear urine.  Bladder:  Moderate trabeculation. A trigone tumor, 1cm just lateral to the left UO. Small diverticulum on the dome. Normal mucosa. No stones.      The procedure was well tolerated and there were no complications.        Flow Rate - 51741  Flow Time: 0:39 min:sec  Time of Peak Flow: 0:09 min:sec  Peak Flow Rate: 6 cc/sec  Voided Volume: 193 cc  Total Void Time: 0:39 min:sec  Average Flow Rate: 5 cc/sec         Urinalysis - 81003 Dipstick Dipstick Cont'd  Specimen: Voided Bilirubin: Neg  Color: Yellow Ketones: Neg  Appearance: Clear Blood: 2+  Specific Gravity: 1.015 Protein: Neg  pH: 5.0 Urobilinogen: 0.2  Glucose: Neg Nitrites: Neg    Leukocyte Esterase: Neg    ASSESSMENT:      ICD-10 Details  1 GU:   Microscopic hematuria - R31.1 72mm tumor adjacent to left UO. I will get him set up for a TURBT with instillation of  gemcitabine. Risks reviewed in detail.   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 I will reassess at f/u.   4   Weak Urinary Stream - R39.12    PLAN:           Schedule Return Visit/Planned Activity: Next Available Appointment - Schedule Surgery             Note: TURBT with instillation of gemcitabine.           Document Letter(s):  Created for Patient: Clinical Summary

## 2018-07-25 NOTE — Interval H&P Note (Signed)
History and Physical Interval Note:  07/25/2018 12:17 PM  Miguel Hill  has presented today for surgery, with the diagnosis of BLADDER TUMOR  The various methods of treatment have been discussed with the patient and family. After consideration of risks, benefits and other options for treatment, the patient has consented to  Procedure(s): CYSTOSCOPY TRANSURETHRAL RESECTION OF BLADDER TUMOR WITH GEMCITABINE INSTILLATION (N/A) as a surgical intervention .  The patient's history has been reviewed, patient examined, no change in status, stable for surgery.  I have reviewed the patient's chart and labs.  Questions were answered to the patient's satisfaction.     Irine Seal

## 2018-07-25 NOTE — Anesthesia Procedure Notes (Signed)
Procedure Name: LMA Insertion Date/Time: 07/25/2018 12:34 PM Performed by: Andree Elk, Amy A, CRNA Pre-anesthesia Checklist: Patient identified, Emergency Drugs available, Suction available, Patient being monitored and Timeout performed Patient Re-evaluated:Patient Re-evaluated prior to induction Oxygen Delivery Method: Circle system utilized Preoxygenation: Pre-oxygenation with 100% oxygen Induction Type: IV induction Ventilation: Mask ventilation without difficulty LMA: LMA inserted LMA Size: 3.0 Number of attempts: 2 (First attempt with LMA 4, did not seat properly;  LMA 3 placed with good seal; VSS throughout) Placement Confirmation: positive ETCO2 and breath sounds checked- equal and bilateral Dental Injury: Teeth and Oropharynx as per pre-operative assessment

## 2018-07-26 DIAGNOSIS — C672 Malignant neoplasm of lateral wall of bladder: Secondary | ICD-10-CM | POA: Diagnosis not present

## 2018-07-26 MED ORDER — ONDANSETRON HCL 4 MG PO TABS: 4.0000 mg | ORAL_TABLET | Freq: Three times a day (TID) | ORAL | 0 refills | Status: DC | PRN

## 2018-07-26 MED ORDER — ACETAMINOPHEN 10 MG/ML IV SOLN
1000.0000 mg | Freq: Four times a day (QID) | INTRAVENOUS | Status: DC
  Filled 2018-07-26 (×4): qty 100

## 2018-07-26 NOTE — Progress Notes (Signed)
Patient complaining of generalized weakness and nausea post urology procedure. Urologist has been notified and received orders to administer Ofirmev IV, however patient has refused medication at this time. Patient is currently sitting up in chair and has voided clear yellow urine. Patient states he wants to "rest right now" and has refused ambulation at this time. Will reassess patient in an hour.

## 2018-07-26 NOTE — Progress Notes (Signed)
Foley catheter removed at this time.

## 2018-07-26 NOTE — Progress Notes (Signed)
I spoke with Miguel nurse taking care of Mr. Miguel Hill today.  Miguel Hill catheter has been removed and Miguel Hill is voiding frequent small amounts.  Miguel Hill is complaining of feeling weak and groggy.  Miguel Hill is questioning whether Miguel Hill can make it at home.  I recommended that we discontinue all his IV narcotic medications and give him a dose of IV Tylenol.  We will discontinue his IV fluids, sit up in a chair, have him eat lunch and move around Miguel room this early afternoon.  Reevaluate for discharge this afternoon.  Miguel Hill is unable to go this afternoon, I will ask Miguel internal medicine doctor at any pen to evaluate him.

## 2018-07-26 NOTE — Discharge Summary (Signed)
Date of admission: 07/25/2018  Date of discharge: 07/26/2018  Admission diagnosis: bladder cancer   Discharge diagnosis: same  Secondary diagnoses:  Patient Active Problem List   Diagnosis Date Noted  . Bladder cancer ([place]) 07/25/2018  . HTN (hypertension) 03/12/2017  . Erectile dysfunction 03/12/2017  . Spinal stenosis 10/10/2016  . Basal cell carcinoma of skin 09/25/2016  . Fatigue due to sleep pattern disturbance 09/22/2015  . Prediabetes     Procedures performed: Procedure(s): CYSTOSCOPY TRANSURETHRAL RESECTION OF BLADDER TUMOR WITH GEMCITABINE INSTILLATION  History and Physical: For full details, please see admission history and physical. Briefly, Miguel Hill is a 84 y.o. year old patient with history of bladder cancer.   Hospital Course: Patient tolerated the procedure well.  He was then transferred to the floor after an uneventful PACU stay.  His hospital course was uncomplicated.  On POD#1 he had met discharge criteria: was eating a regular diet, was up and ambulating independently,  pain was well controlled, was voiding without a catheter, and was ready to for discharge.   Laboratory values:  Recent Labs    07/23/18 1455  WBC 4.8  HGB 14.5  HCT 44.0   Recent Labs    07/23/18 1455  NA 139  K 3.7  CL 106  CO2 26  GLUCOSE 142*  BUN 17  CREATININE 1.01  CALCIUM 9.0   No results for input(s): LABPT, INR in the last 72 hours. No results for input(s): LABURIN in the last 72 hours. Results for orders placed or performed during the hospital encounter of 03/14/18  Urine Culture     Status: None   Collection Time: 03/14/18 12:09 PM  Result Value Ref Range Status   Specimen Description   Final    URINE, RANDOM Performed at [facility], [address] [place], [place] [address]    Special Requests   Final    NONE Performed at [facility], [address], [place], [place] [address]    Culture   Final    NO GROWTH Performed at [facility], [place] [address] [place], [place] [address]    Report Status 03/15/2018 FINAL  Final    Disposition: Home  Discharge instruction: The patient was instructed to be ambulatory but told to refrain from heavy lifting, strenuous activity, or driving.   Discharge medications:  Allergies as of 07/26/2018   No Known Allergies     Medication List    TAKE these medications   ACCU-CHEK AVIVA PLUS test strip Generic drug:  glucose blood   amLODipine 5 MG tablet Commonly known as:  NORVASC Take 0.5 tablets (2.5 mg total) by mouth daily.   HYDROcodone-acetaminophen 7.5-325 MG tablet Commonly known as:  NORCO Take 1 tablet by mouth every 6 (six) hours as needed for moderate pain (for pain score 1-4).   ondansetron 4 MG tablet Commonly known as:  ZOFRAN Take 1 tablet (4 mg total) by mouth every 8 (eight) hours as needed for nausea or vomiting.       Followup:  Follow-up Information    Irine Seal, MD On 08/08/2018.   Specialty:  Urology Why:  2pm Contact information: [address] [place] [address] [phone]

## 2018-07-26 NOTE — Progress Notes (Signed)
Discharge instructions including medications and follow up appointments were reviewed and discussed with patient and his caregiver Shirlee Latch. All questions were answered and no further questions at this time. Pt in stable condition and in no acute distress at time of discharge. Pt escorted by RN

## 2018-07-28 ENCOUNTER — Encounter (HOSPITAL_COMMUNITY): Payer: Self-pay | Admitting: Urology

## 2018-08-04 ENCOUNTER — Ambulatory Visit: Payer: Medicare Other | Admitting: Family Medicine

## 2018-08-04 ENCOUNTER — Encounter: Payer: Self-pay | Admitting: Family Medicine

## 2018-08-04 VITALS — BP 134/72 | HR 66 | Temp 97.3°F | Ht 70.0 in | Wt 144.0 lb

## 2018-08-04 DIAGNOSIS — R300 Dysuria: Secondary | ICD-10-CM

## 2018-08-04 DIAGNOSIS — N390 Urinary tract infection, site not specified: Secondary | ICD-10-CM | POA: Diagnosis not present

## 2018-08-04 DIAGNOSIS — N9989 Other postprocedural complications and disorders of genitourinary system: Secondary | ICD-10-CM

## 2018-08-04 LAB — URINALYSIS, COMPLETE
Bilirubin, UA: NEGATIVE
Glucose, UA: NEGATIVE
Ketones, UA: NEGATIVE
Nitrite, UA: NEGATIVE
Specific Gravity, UA: 1.015 (ref 1.005–1.030)
Urobilinogen, Ur: 0.2 mg/dL (ref 0.2–1.0)
pH, UA: 5.5 (ref 5.0–7.5)

## 2018-08-04 LAB — MICROSCOPIC EXAMINATION

## 2018-08-04 MED ORDER — CIPROFLOXACIN HCL 500 MG PO TABS: 500.0000 mg | ORAL_TABLET | Freq: Two times a day (BID) | ORAL | 0 refills | Status: AC

## 2018-08-04 NOTE — Patient Instructions (Signed)

## 2018-08-04 NOTE — Progress Notes (Signed)
Subjective:    Patient ID: Miguel Hill, male    DOB: 1932-08-29, 84 y.o.   MRN: [id number]  Chief Complaint:  Urinary Tract Infection (dysuria, left side pain; )   HPI: Miguel Hill is a 84 y.o. male presenting on 08/04/2018 for Urinary Tract Infection (dysuria, left side pain; )  Pt presents today for complaints of dysuria and left lower abdominal pain. He states he had a cystoscopy with resection of a bladder tumor on 07/25/18. He reports he developed his symptoms 4-5 days ago and they have become worse. States he called his Psychologist, sport and exercise and was told to take Azo. States this did not help his symptoms. He denies fever, chills, hematuria, nausea, vomiting, or confusion. He denies frequency or urgency.   Relevant past medical, surgical, family, and social history reviewed and updated as indicated.  Allergies and medications reviewed and updated.   Past Medical History:  Diagnosis Date  . Hypertension   . PONV (postoperative nausea and vomiting)   . Prediabetes     Past Surgical History:  Procedure Laterality Date  . HERNIA REPAIR Bilateral   . TRANSURETHRAL RESECTION OF BLADDER TUMOR WITH MITOMYCIN-C N/A 07/25/2018   Procedure: CYSTOSCOPY TRANSURETHRAL RESECTION OF BLADDER TUMOR WITH GEMCITABINE INSTILLATION;  Surgeon: Irine Seal, MD;  Location: [facility];  Service: Urology;  Laterality: N/A;    Social History   Socioeconomic History  . Marital status: Divorced    Spouse name: Not on file  . Number of children: Not on file  . Years of education: Not on file  . Highest education level: Not on file  Occupational History  . Not on file  Social Needs  . Financial resource strain: Not on file  . Food insecurity:    Worry: Not on file    Inability: Not on file  . Transportation needs:    Medical: Not on file    Non-medical: Not on file  Tobacco Use  . Smoking status: Former Smoker    Last attempt to quit: 04/03/1985    Years since quitting: 33.3  . Smokeless tobacco:  Current User    Types: Chew  . Tobacco comment: rarely uses chewing tobacco  Substance and Sexual Activity  . Alcohol use: No  . Drug use: No  . Sexual activity: Not Currently    Birth control/protection: None  Lifestyle  . Physical activity:    Days per week: Not on file    Minutes per session: Not on file  . Stress: Not on file  Relationships  . Social connections:    Talks on phone: Not on file    Gets together: Not on file    Attends religious service: Not on file    Active member of club or organization: Not on file    Attends meetings of clubs or organizations: Not on file    Relationship status: Not on file  . Intimate partner violence:    Fear of current or ex partner: Not on file    Emotionally abused: Not on file    Physically abused: Not on file    Forced sexual activity: Not on file  Other Topics Concern  . Not on file  Social History Narrative  . Not on file    Outpatient Encounter Medications as of 08/04/2018  Medication Sig  . ACCU-CHEK AVIVA PLUS test strip   . amLODipine (NORVASC) 5 MG tablet Take 0.5 tablets (2.5 mg total) by mouth daily. (Patient not taking: Reported on 12/30/2017)  .  ciprofloxacin (CIPRO) 500 MG tablet Take 1 tablet (500 mg total) by mouth 2 (two) times daily for 14 days.  Marland Kitchen HYDROcodone-acetaminophen (NORCO) 7.5-325 MG tablet Take 1 tablet by mouth every 6 (six) hours as needed for moderate pain (for pain score 1-4). (Patient not taking: Reported on 08/04/2018)  . ondansetron (ZOFRAN) 4 MG tablet Take 1 tablet (4 mg total) by mouth every 8 (eight) hours as needed for nausea or vomiting. (Patient not taking: Reported on 08/04/2018)   No facility-administered encounter medications on file as of 08/04/2018.     No Known Allergies  Review of Systems  Constitutional: Negative for chills, fatigue and fever.  Respiratory: Negative for shortness of breath.   Cardiovascular: Negative for chest pain and palpitations.  Gastrointestinal:  Positive for abdominal pain (left lower ). Negative for constipation, nausea and vomiting.  Genitourinary: Positive for dysuria and flank pain (left). Negative for decreased urine volume, difficulty urinating, hematuria, penile pain, penile swelling, scrotal swelling, testicular pain and urgency.  Neurological: Negative for weakness.  Psychiatric/Behavioral: Negative for confusion.  All other systems reviewed and are negative.       Objective:    BP 134/72   Pulse 66   Temp (!) 97.3 F (36.3 C) (Oral)   Ht 5\' 10"  (1.778 m)   Wt 144 lb (65.3 kg)   BMI 20.66 kg/m    Wt Readings from Last 3 Encounters:  08/04/18 144 lb (65.3 kg)  07/23/18 146 lb 6.2 oz (66.4 kg)  12/30/17 146 lb 6.4 oz (66.4 kg)    Physical Exam Vitals signs and nursing note reviewed.  Constitutional:      General: He is not in acute distress.    Appearance: Normal appearance. He is well-developed and well-groomed. He is not ill-appearing or toxic-appearing.  HENT:     Head: Normocephalic and atraumatic.     Mouth/Throat:     Mouth: Mucous membranes are moist.     Pharynx: Oropharynx is clear.  Eyes:     Conjunctiva/sclera: Conjunctivae normal.     Pupils: Pupils are equal, round, and reactive to light.  Cardiovascular:     Rate and Rhythm: Normal rate and regular rhythm.     Heart sounds: Normal heart sounds. No murmur. No friction rub. No gallop.   Pulmonary:     Effort: Pulmonary effort is normal. No respiratory distress.     Breath sounds: Normal breath sounds.  Abdominal:     General: Abdomen is flat. Bowel sounds are normal. There is no distension.     Palpations: Abdomen is soft.     Tenderness: There is abdominal tenderness (mild) in the suprapubic area. There is no right CVA tenderness, left CVA tenderness, guarding or rebound.  Skin:    General: Skin is warm and dry.     Capillary Refill: Capillary refill takes less than 2 seconds.  Neurological:     Mental Status: He is alert and oriented  to person, place, and time.  Psychiatric:        Mood and Affect: Mood normal.        Behavior: Behavior normal. Behavior is cooperative.        Thought Content: Thought content normal.        Judgment: Judgment normal.     Results for orders placed or performed in visit on 08/04/18  Microscopic Examination  Result Value Ref Range   WBC, UA 6-10 (A) 0 - 5 /hpf   RBC, UA 11-30 (A) 0 - 2 /hpf  Epithelial Cells (non renal) 0-10 0 - 10 /hpf   Renal Epithel, UA 0-10 (A) None seen /hpf   Mucus, UA Present Not Estab.   Bacteria, UA Few None seen/Few  Urinalysis, Complete  Result Value Ref Range   Specific Gravity, UA 1.015 1.005 - 1.030   pH, UA 5.5 5.0 - 7.5   Color, UA Yellow Yellow   Appearance Ur Clear Clear   Leukocytes, UA 1+ (A) Negative   Protein, UA 2+ (A) Negative/Trace   Glucose, UA Negative Negative   Ketones, UA Negative Negative   RBC, UA 3+ (A) Negative   Bilirubin, UA Negative Negative   Urobilinogen, Ur 0.2 0.2 - 1.0 mg/dL   Nitrite, UA Negative Negative   Microscopic Examination See below:      Urinalysis in office: positive nitrites, 1+ leukocytes, 2+ protein, 3+ blood, many bacteria.   Pertinent labs & imaging results that were available during my care of the patient were reviewed by me and considered in my medical decision making.  Assessment & Plan:  Jered was seen today for urinary tract infection.  Diagnoses and all orders for this visit:  Dysuria -     Urinalysis, Complete -     Urine Culture  Postoperative UTI (urinary tract infection) Increase water intake. Keep follow up appointment with urology on Friday. Medications as prescribed. Culture pending, will change therapy if warranted. Return for new or worsening symptoms.  -     ciprofloxacin (CIPRO) 500 MG tablet; Take 1 tablet (500 mg total) by mouth 2 (two) times daily for 14 days.   Continue all other maintenance medications.  Follow up plan: Return in about 4 weeks (around 09/01/2018),  or if symptoms worsen or fail to improve.  Educational handout given for Dysuria  The above assessment and management plan was discussed with the patient. The patient verbalized understanding of and has agreed to the management plan. Patient is aware to call the clinic if symptoms persist or worsen. Patient is aware when to return to the clinic for a follow-up visit. Patient educated on when it is appropriate to go to the emergency department.   Monia Pouch, FNP-C [facility] [phone]

## 2018-08-05 ENCOUNTER — Other Ambulatory Visit: Payer: Self-pay

## 2018-08-05 ENCOUNTER — Encounter (HOSPITAL_COMMUNITY): Payer: Self-pay | Admitting: Emergency Medicine

## 2018-08-05 ENCOUNTER — Emergency Department (HOSPITAL_COMMUNITY)
Admission: EM | Admit: 2018-08-05 | Discharge: 2018-08-05 | Disposition: A | Discharge status: Home / Self Care | Payer: Medicare Other | Attending: Emergency Medicine | Admitting: Emergency Medicine

## 2018-08-05 DIAGNOSIS — Z85828 Personal history of other malignant neoplasm of skin: Secondary | ICD-10-CM | POA: Diagnosis not present

## 2018-08-05 DIAGNOSIS — Z8551 Personal history of malignant neoplasm of bladder: Secondary | ICD-10-CM | POA: Diagnosis not present

## 2018-08-05 DIAGNOSIS — R7303 Prediabetes: Secondary | ICD-10-CM | POA: Insufficient documentation

## 2018-08-05 DIAGNOSIS — F17220 Nicotine dependence, chewing tobacco, uncomplicated: Secondary | ICD-10-CM | POA: Insufficient documentation

## 2018-08-05 DIAGNOSIS — R3129 Other microscopic hematuria: Secondary | ICD-10-CM | POA: Diagnosis not present

## 2018-08-05 DIAGNOSIS — R319 Hematuria, unspecified: Secondary | ICD-10-CM | POA: Diagnosis present

## 2018-08-05 DIAGNOSIS — I10 Essential (primary) hypertension: Secondary | ICD-10-CM | POA: Diagnosis not present

## 2018-08-05 LAB — CBC WITH DIFFERENTIAL/PLATELET
Abs Immature Granulocytes: 0.02 10*3/uL (ref 0.00–0.07)
Basophils Absolute: 0 10*3/uL (ref 0.0–0.1)
Basophils Relative: 0 %
Eosinophils Absolute: 0.1 10*3/uL (ref 0.0–0.5)
Eosinophils Relative: 1 %
HCT: 40.1 % (ref 39.0–52.0)
Hemoglobin: 13 g/dL (ref 13.0–17.0)
IMMATURE GRANULOCYTES: 0 %
Lymphocytes Relative: 19 %
Lymphs Abs: 1.3 10*3/uL (ref 0.7–4.0)
MCH: 29.6 pg (ref 26.0–34.0)
MCHC: 32.4 g/dL (ref 30.0–36.0)
MCV: 91.3 fL (ref 80.0–100.0)
Monocytes Absolute: 0.8 10*3/uL (ref 0.1–1.0)
Monocytes Relative: 12 %
Neutro Abs: 4.6 10*3/uL (ref 1.7–7.7)
Neutrophils Relative %: 68 %
Platelets: 148 10*3/uL — ABNORMAL LOW (ref 150–400)
RBC: 4.39 MIL/uL (ref 4.22–5.81)
RDW: 13.1 % (ref 11.5–15.5)
WBC: 6.8 10*3/uL (ref 4.0–10.5)
nRBC: 0 % (ref 0.0–0.2)

## 2018-08-05 LAB — BASIC METABOLIC PANEL
Anion gap: 7 (ref 5–15)
BUN: 24 mg/dL — ABNORMAL HIGH (ref 8–23)
CO2: 26 mmol/L (ref 22–32)
Calcium: 8.9 mg/dL (ref 8.9–10.3)
Chloride: 107 mmol/L (ref 98–111)
Creatinine, Ser: 1.12 mg/dL (ref 0.61–1.24)
GFR calc Af Amer: >60 mL/min (ref >60)
GFR calc non Af Amer: >60 mL/min (ref >60)
Glucose, Bld: 143 mg/dL — ABNORMAL HIGH (ref 70–99)
Potassium: 3.8 mmol/L (ref 3.5–5.1)
Sodium: 140 mmol/L (ref 135–145)

## 2018-08-05 LAB — URINALYSIS, ROUTINE W REFLEX MICROSCOPIC
Bacteria, UA: NONE SEEN
Bilirubin Urine: NEGATIVE
Glucose, UA: NEGATIVE mg/dL
Ketones, ur: NEGATIVE mg/dL
Nitrite: NEGATIVE
Protein, ur: 30 mg/dL — AB
SPECIFIC GRAVITY, URINE: 1.017 (ref 1.005–1.030)
pH: 5 (ref 5.0–8.0)

## 2018-08-05 NOTE — ED Provider Notes (Signed)
[facility] DEPT Provider Note   CSN: [id number] Arrival date & time: 08/05/18  1635     History   Chief Complaint Chief Complaint  Patient presents with  . Hematuria    HPI Miguel Hill is a 84 y.o. male.  HPI Patient seen yesterday at PCP and started on Cipro for urinary tract infection.  Today he developed hematuria with urinating some blood clots.  He denies any trouble emptying his bladder.  He complained of sharp left lower quadrant pain yesterday and today presently is pain-free.  He last treated himself with hydrocodone 6 hours prior to coming here.  He is taken 2 doses of Cipro for UTI.  He feels well presently.  No nausea or vomiting.  No lightheadedness.  No other associated symptoms Past Medical History:  Diagnosis Date  . Hypertension   . PONV (postoperative nausea and vomiting)   . Prediabetes     Patient Active Problem List   Diagnosis Date Noted  . Bladder cancer ([place]) 07/25/2018  . HTN (hypertension) 03/12/2017  . Erectile dysfunction 03/12/2017  . Spinal stenosis 10/10/2016  . Basal cell carcinoma of skin 09/25/2016  . Fatigue due to sleep pattern disturbance 09/22/2015  . Prediabetes     Past Surgical History:  Procedure Laterality Date  . HERNIA REPAIR Bilateral   . TRANSURETHRAL RESECTION OF BLADDER TUMOR WITH MITOMYCIN-C N/A 07/25/2018   Procedure: CYSTOSCOPY TRANSURETHRAL RESECTION OF BLADDER TUMOR WITH GEMCITABINE INSTILLATION;  Surgeon: Irine Seal, MD;  Location: [facility];  Service: Urology;  Laterality: N/A;        Home Medications    Prior to Admission medications   Medication Sig Start Date End Date Taking? Authorizing Provider  ciprofloxacin (CIPRO) 500 MG tablet Take 1 tablet (500 mg total) by mouth 2 (two) times daily for 14 days. 08/04/18 08/18/18 Yes Rakes, Connye Burkitt, FNP  HYDROcodone-acetaminophen (NORCO) 7.5-325 MG tablet Take 1 tablet by mouth every 6 (six) hours as needed for moderate pain (for pain  score 1-4). Patient taking differently: Take 0.5-1 tablets by mouth every 6 (six) hours as needed for moderate pain (for pain score 1-4).  07/25/18  Yes Irine Seal, MD  ondansetron (ZOFRAN) 4 MG tablet Take 1 tablet (4 mg total) by mouth every 8 (eight) hours as needed for nausea or vomiting. 07/26/18  Yes Ardis Hughs, MD  ACCU-CHEK AVIVA PLUS test strip  08/26/16   [provider]  amLODipine (NORVASC) 5 MG tablet Take 0.5 tablets (2.5 mg total) by mouth daily. Patient not taking: Reported on 08/05/2018 12/17/17   Eustaquio Maize, MD    Family History Family History  Problem Relation Age of Onset  . Heart attack Father     Social History Social History   Tobacco Use  . Smoking status: Former Smoker    Last attempt to quit: 04/03/1985    Years since quitting: 33.3  . Smokeless tobacco: Current User    Types: Chew  . Tobacco comment: rarely uses chewing tobacco  Substance Use Topics  . Alcohol use: No  . Drug use: No     Allergies   Patient has no known allergies.   Review of Systems Review of Systems  Constitutional: Negative.   HENT: Negative.   Respiratory: Negative.   Cardiovascular: Negative.   Gastrointestinal: Negative.   Genitourinary: Positive for hematuria.  Musculoskeletal: Negative.   Skin: Negative.   Allergic/Immunologic: Positive for immunocompromised state.       Cancer patient  Neurological: Negative.  Psychiatric/Behavioral: Negative.   All other systems reviewed and are negative.    Physical Exam Updated Vital Signs BP (!) 170/88 (BP Location: Right Arm)   Pulse 82   Temp 98.9 F (37.2 C) (Oral)   Resp 18   Ht 5\' 11"  (1.803 m)   Wt 68 kg   SpO2 99%   BMI 20.92 kg/m   Physical Exam Vitals signs and nursing note reviewed.  Constitutional:      Appearance: He is well-developed.  HENT:     Head: Normocephalic and atraumatic.  Eyes:     Conjunctiva/sclera: Conjunctivae normal.     Pupils: Pupils are equal, round, and  reactive to light.  Neck:     Musculoskeletal: Neck supple.     Thyroid: No thyromegaly.     Trachea: No tracheal deviation.  Cardiovascular:     Rate and Rhythm: Normal rate and regular rhythm.     Heart sounds: No murmur.  Pulmonary:     Effort: Pulmonary effort is normal.     Breath sounds: Normal breath sounds.  Abdominal:     General: Bowel sounds are normal. There is no distension.     Palpations: Abdomen is soft.     Tenderness: There is no abdominal tenderness.  Genitourinary:    Penis: Normal.      Scrotum/Testes: Normal.  Musculoskeletal: Normal range of motion.        General: No tenderness.  Skin:    General: Skin is warm and dry.     Findings: No rash.  Neurological:     Mental Status: He is alert.     Coordination: Coordination normal.      ED Treatments / Results  Labs (all labs ordered are listed, but only abnormal results are displayed) Labs Reviewed  URINALYSIS, ROUTINE W REFLEX MICROSCOPIC - Abnormal; Notable for the following components:      Result Value   Hgb urine dipstick LARGE (*)    Protein, ur 30 (*)    Leukocytes, UA TRACE (*)    All other components within normal limits  URINE CULTURE  BASIC METABOLIC PANEL  CBC WITH DIFFERENTIAL/PLATELET    EKG None  Radiology No results found.  Procedures Procedures (including critical care time)  Medications Ordered in ED Medications - No data to display Results for orders placed or performed during the hospital encounter of 08/05/18  Urinalysis, Routine w reflex microscopic- may I&O cath if menses  Result Value Ref Range   Color, Urine YELLOW YELLOW   APPearance CLEAR CLEAR   Specific Gravity, Urine 1.017 1.005 - 1.030   pH 5.0 5.0 - 8.0   Glucose, UA NEGATIVE NEGATIVE mg/dL   Hgb urine dipstick LARGE (A) NEGATIVE   Bilirubin Urine NEGATIVE NEGATIVE   Ketones, ur NEGATIVE NEGATIVE mg/dL   Protein, ur 30 (A) NEGATIVE mg/dL   Nitrite NEGATIVE NEGATIVE   Leukocytes, UA TRACE (A)  NEGATIVE   RBC / HPF 21-50 0 - 5 RBC/hpf   WBC, UA 6-10 0 - 5 WBC/hpf   Bacteria, UA NONE SEEN NONE SEEN   Mucus PRESENT   Basic metabolic panel  Result Value Ref Range   Sodium 140 135 - 145 mmol/L   Potassium 3.8 3.5 - 5.1 mmol/L   Chloride 107 98 - 111 mmol/L   CO2 26 22 - 32 mmol/L   Glucose, Bld 143 (H) 70 - 99 mg/dL   BUN 24 (H) 8 - 23 mg/dL   Creatinine, Ser 1.12 0.61 - 1.24 mg/dL  Calcium 8.9 8.9 - 10.3 mg/dL   GFR calc non Af Amer >60 >60 mL/min   GFR calc Af Amer >60 >60 mL/min   Anion gap 7 5 - 15  CBC with Differential/Platelet  Result Value Ref Range   WBC 6.8 4.0 - 10.5 K/uL   RBC 4.39 4.22 - 5.81 MIL/uL   Hemoglobin 13.0 13.0 - 17.0 g/dL   HCT 40.1 39.0 - 52.0 %   MCV 91.3 80.0 - 100.0 fL   MCH 29.6 26.0 - 34.0 pg   MCHC 32.4 30.0 - 36.0 g/dL   RDW 13.1 11.5 - 15.5 %   Platelets 148 (L) 150 - 400 K/uL   nRBC 0.0 0.0 - 0.2 %   Neutrophils Relative % 68 %   Neutro Abs 4.6 1.7 - 7.7 K/uL   Lymphocytes Relative 19 %   Lymphs Abs 1.3 0.7 - 4.0 K/uL   Monocytes Relative 12 %   Monocytes Absolute 0.8 0.1 - 1.0 K/uL   Eosinophils Relative 1 %   Eosinophils Absolute 0.1 0.0 - 0.5 K/uL   Basophils Relative 0 %   Basophils Absolute 0.0 0.0 - 0.1 K/uL   Immature Granulocytes 0 %   Abs Immature Granulocytes 0.02 0.00 - 0.07 K/uL   No results found.  Initial Impression / Assessment and Plan / ED Course  I have reviewed the triage vital signs and the nursing notes.  Pertinent labs & imaging results that were available during my care of the patient were reviewed by me and considered in my medical decision making (see chart for details).   9:10 PM patient alert no distress..  Not lightheaded on standing.  Ambulates well difficulty.  Plan urine sent for culture.  Continue Cipro.  Keep scheduled appoint with Dr. Jeffie Pollock 05/09/2018 Lab work consistent with microscopic materia and suspected partially treated urinary tract infection.  Otherwise normal.  Blood pressure  recheck 1 week   Final Clinical Impressions(s) / ED Diagnoses  Dx #1hematuria #2 elevated blood pressure Final diagnoses:  None    ED Discharge Orders    None       Orlie Dakin, MD 08/05/18 2121

## 2018-08-05 NOTE — Discharge Instructions (Addendum)
Your scheduled appointment with Dr. Jeffie Pollock this week.  Return to the emergency department if you develop fever, vomiting, feel faint or feel worse for any reason.  Your blood pressure should be rechecked by your primary care physician in a week.  Today's was elevated at 170/88

## 2018-08-05 NOTE — ED Triage Notes (Signed)
Patient c/o hematuria with clots today. Hx bladder cancer. Had tumors removed from bladder on 12/6. Seen at PCP yesterday and started on cipro to treat UTI.

## 2018-08-06 LAB — URINE CULTURE

## 2018-08-07 LAB — URINE CULTURE: Culture: NO GROWTH

## 2018-08-08 ENCOUNTER — Ambulatory Visit (INDEPENDENT_AMBULATORY_CARE_PROVIDER_SITE_OTHER): Payer: Medicare Other | Admitting: Urology

## 2018-08-08 DIAGNOSIS — C670 Malignant neoplasm of trigone of bladder: Secondary | ICD-10-CM | POA: Diagnosis not present

## 2018-08-08 DIAGNOSIS — N130 Hydronephrosis with ureteropelvic junction obstruction: Secondary | ICD-10-CM | POA: Diagnosis not present

## 2018-08-08 DIAGNOSIS — R300 Dysuria: Secondary | ICD-10-CM | POA: Diagnosis not present

## 2018-08-18 ENCOUNTER — Other Ambulatory Visit (HOSPITAL_COMMUNITY): Payer: Self-pay | Admitting: Urology

## 2018-08-18 DIAGNOSIS — N130 Hydronephrosis with ureteropelvic junction obstruction: Secondary | ICD-10-CM

## 2018-08-19 ENCOUNTER — Ambulatory Visit (HOSPITAL_COMMUNITY)
Admission: RE | Admit: 2018-08-19 | Discharge: 2018-08-19 | Disposition: A | Discharge status: Home / Self Care | Payer: Medicare Other | Source: Ambulatory Visit | Attending: Urology | Admitting: Urology

## 2018-08-19 DIAGNOSIS — N130 Hydronephrosis with ureteropelvic junction obstruction: Secondary | ICD-10-CM | POA: Insufficient documentation

## 2018-09-05 ENCOUNTER — Ambulatory Visit: Payer: Medicare Other | Admitting: Urology

## 2018-09-05 ENCOUNTER — Other Ambulatory Visit (HOSPITAL_COMMUNITY)
Admission: RE | Admit: 2018-09-05 | Discharge: 2018-09-05 | Disposition: A | Discharge status: Home / Self Care | Payer: Medicare Other | Source: Ambulatory Visit | Attending: Urology | Admitting: Urology

## 2018-09-05 DIAGNOSIS — R300 Dysuria: Secondary | ICD-10-CM | POA: Insufficient documentation

## 2018-09-05 DIAGNOSIS — C670 Malignant neoplasm of trigone of bladder: Secondary | ICD-10-CM | POA: Diagnosis not present

## 2018-09-05 DIAGNOSIS — N130 Hydronephrosis with ureteropelvic junction obstruction: Secondary | ICD-10-CM | POA: Diagnosis not present

## 2018-09-05 LAB — URINALYSIS, COMPLETE (UACMP) WITH MICROSCOPIC
Bacteria, UA: NONE SEEN
Bilirubin Urine: NEGATIVE
Glucose, UA: NEGATIVE mg/dL
Ketones, ur: NEGATIVE mg/dL
Nitrite: NEGATIVE
Protein, ur: NEGATIVE mg/dL
Specific Gravity, Urine: 1.019 (ref 1.005–1.030)
pH: 5 (ref 5.0–8.0)

## 2018-09-06 LAB — URINE CULTURE: Culture: <10000 — AB

## 2018-09-19 ENCOUNTER — Ambulatory Visit (INDEPENDENT_AMBULATORY_CARE_PROVIDER_SITE_OTHER): Payer: Medicare Other | Admitting: Urology

## 2018-09-19 DIAGNOSIS — C670 Malignant neoplasm of trigone of bladder: Secondary | ICD-10-CM | POA: Diagnosis not present

## 2018-09-26 ENCOUNTER — Ambulatory Visit (INDEPENDENT_AMBULATORY_CARE_PROVIDER_SITE_OTHER): Payer: Medicare Other | Admitting: Urology

## 2018-09-26 DIAGNOSIS — C670 Malignant neoplasm of trigone of bladder: Secondary | ICD-10-CM | POA: Diagnosis not present

## 2018-10-08 ENCOUNTER — Ambulatory Visit: Payer: Medicare Other | Admitting: Urology

## 2018-10-17 ENCOUNTER — Ambulatory Visit: Payer: Medicare Other | Admitting: Urology

## 2018-11-11 ENCOUNTER — Encounter: Payer: Medicare Other | Admitting: Family Medicine

## 2018-11-12 ENCOUNTER — Telehealth: Payer: Medicare Other | Admitting: Family Medicine

## 2018-11-12 ENCOUNTER — Other Ambulatory Visit: Payer: Self-pay

## 2018-11-12 NOTE — Progress Notes (Signed)
After chart review, based on complexity of case and CC, plus no previous experience with this patient, I requested this encounter be rescheduled as a face to face visit. WS

## 2018-11-13 ENCOUNTER — Other Ambulatory Visit: Payer: Self-pay

## 2018-11-14 ENCOUNTER — Telehealth: Payer: Self-pay | Admitting: Family Medicine

## 2018-11-14 ENCOUNTER — Ambulatory Visit: Payer: Medicare Other | Admitting: Family Medicine

## 2018-11-14 ENCOUNTER — Encounter: Payer: Self-pay | Admitting: Family Medicine

## 2018-11-14 VITALS — BP 189/81 | HR 59 | Temp 98.1°F | Ht 71.0 in | Wt 142.2 lb

## 2018-11-14 DIAGNOSIS — R7303 Prediabetes: Secondary | ICD-10-CM

## 2018-11-14 DIAGNOSIS — R634 Abnormal weight loss: Secondary | ICD-10-CM

## 2018-11-14 DIAGNOSIS — R319 Hematuria, unspecified: Secondary | ICD-10-CM

## 2018-11-14 DIAGNOSIS — R5383 Other fatigue: Secondary | ICD-10-CM | POA: Diagnosis not present

## 2018-11-14 DIAGNOSIS — N529 Male erectile dysfunction, unspecified: Secondary | ICD-10-CM

## 2018-11-14 DIAGNOSIS — E039 Hypothyroidism, unspecified: Secondary | ICD-10-CM

## 2018-11-14 DIAGNOSIS — I10 Essential (primary) hypertension: Secondary | ICD-10-CM

## 2018-11-14 DIAGNOSIS — E559 Vitamin D deficiency, unspecified: Secondary | ICD-10-CM

## 2018-11-14 LAB — URINALYSIS
Bilirubin, UA: NEGATIVE
Glucose, UA: NEGATIVE
Ketones, UA: NEGATIVE
Leukocytes,UA: NEGATIVE
NITRITE UA: NEGATIVE
Protein, UA: NEGATIVE
Specific Gravity, UA: 1.015 (ref 1.005–1.030)
Urobilinogen, Ur: 0.2 mg/dL (ref 0.2–1.0)
pH, UA: 5.5 (ref 5.0–7.5)

## 2018-11-14 NOTE — Telephone Encounter (Signed)
Pt notified of results Verbalizes understanding 

## 2018-11-14 NOTE — Progress Notes (Signed)
Subjective:  Patient ID: Miguel Hill, male    DOB: 02-Jun-1933  Age: 85 y.o. MRN: [id number]  CC: Fatigue (pt here today c/o increased fatigue and also wants to have urine checked due to having TURP 07/25/18)   HPI Miguel Hill presents for profound fatigue.  He tells me that starting about 3 years ago he noted that he would get sleepy and weak after eating.  This seemed to be related to all foods including vitamins.  He also notes that when he drinks coffee.  However, he drinks decaf coffee due to history of a bladder tumor which was removed in December 2019.  Over the last 2 months the weakness has profoundly increased as had the fatigue.  He says he has trouble during the least little bit of housecleaning such as vacuuming or preparing a meal for himself.  He does live alone.  He is fatigued before he starts and can only proceed for a few minutes before having to stop and rest.  This includes activities such as walking around the yard such as to the [place] etc.  The patient has had a bladder tumor reviewed as well as had BCG instilled into his bladder in February of this year.  Patient notes that he lost 10 pounds over the last 3 years.  His normal weight is about 150.  Review of the chart reveals that patient does have a history of hypothyroidism.  He is not taking medicine for that.  He states that is because he is very sensitive to medications in general.  Mr. Gehres tells me today that he has done quite a bit of research and has come up with a list of tests for conditions that could lead to his fatigue problem.  Among these are at bedtime-CRP, magnesium level, homocystine, TSH T4 T3, free T4, free T3, testosterone, glucose, A1c, insulin, C-peptide, vitamin D, cortisol, DHEA-S, iron studies, estradiol, additionally I recommended that he have a phosphorus level dropped for total T4 T3 in favor of just doing the TSH free T3 and T4 free T4.  I added a B12 and folate level.  I dropped the insulin  level as well as the C-peptide.  Those will be reconsidered once his blood tests have returned should he have an A1c greater than 6.5.  Depression screen [facility] 2/9 11/14/2018 08/04/2018 12/30/2017  Decreased Interest 0 0 0  Down, Depressed, Hopeless 0 0 0  PHQ - 2 Score 0 0 0  Altered sleeping - - -  Tired, decreased energy - - -  Change in appetite - - -  Feeling bad or failure about yourself  - - -  Trouble concentrating - - -  Moving slowly or fidgety/restless - - -  Suicidal thoughts - - -  PHQ-9 Score - - -    History Miguel Hill has a past medical history of Hypertension, PONV (postoperative nausea and vomiting), and Prediabetes.   He has a past surgical history that includes Hernia repair (Bilateral) and Transurethral resection of bladder tumor with mitomycin-c (N/A, 07/25/2018).   His family history includes Heart attack in his father.He reports that he quit smoking about 33 years ago. His smokeless tobacco use includes chew. He reports that he does not drink alcohol or use drugs.    ROS Review of Systems  Constitutional: Positive for activity change, fatigue and unexpected weight change. Negative for appetite change, chills, diaphoresis and fever.  HENT: Negative.   Eyes: Negative for visual disturbance.  Respiratory: Negative for cough  and shortness of breath.   Cardiovascular: Negative for chest pain and leg swelling.  Gastrointestinal: Negative for abdominal pain, diarrhea, nausea and vomiting.  Genitourinary: Positive for hematuria. Negative for difficulty urinating.  Musculoskeletal: Negative for arthralgias and myalgias.  Skin: Negative for rash.  Neurological: Negative for headaches.  Psychiatric/Behavioral: Negative for sleep disturbance.    Objective:  BP (!) 189/81   Pulse (!) 59   Temp 98.1 F (36.7 C) (Oral)   Ht '5\' 11"'$  (1.803 m)   Wt 142 lb 4 oz (64.5 kg)   BMI 19.84 kg/m   BP Readings from Last 3 Encounters:  11/14/18 (!) 189/81  08/05/18 (!) 162/84   08/04/18 134/72    Wt Readings from Last 3 Encounters:  11/14/18 142 lb 4 oz (64.5 kg)  08/05/18 150 lb (68 kg)  08/04/18 144 lb (65.3 kg)     Physical Exam Constitutional:      General: He is not in acute distress.    Appearance: He is well-developed.  HENT:     Head: Normocephalic and atraumatic.     Right Ear: External ear normal.     Left Ear: External ear normal.     Nose: Nose normal.  Eyes:     Conjunctiva/sclera: Conjunctivae normal.     Pupils: Pupils are equal, round, and reactive to light.  Neck:     Musculoskeletal: Normal range of motion and neck supple.  Cardiovascular:     Rate and Rhythm: Normal rate and regular rhythm.     Heart sounds: Normal heart sounds. No murmur.  Pulmonary:     Effort: Pulmonary effort is normal. No respiratory distress.     Breath sounds: Normal breath sounds. No wheezing or rales.  Abdominal:     Palpations: Abdomen is soft.     Tenderness: There is no abdominal tenderness.  Musculoskeletal: Normal range of motion.  Skin:    General: Skin is warm and dry.  Neurological:     Mental Status: He is alert and oriented to person, place, and time.     Deep Tendon Reflexes: Reflexes are normal and symmetric.  Psychiatric:        Behavior: Behavior normal.        Thought Content: Thought content normal.        Judgment: Judgment normal.       Assessment & Plan:   Gresham was seen today for fatigue.  Diagnoses and all orders for this visit:  Hypothyroidism, unspecified type  Prediabetes -     Bayer DCA Hb A1c Waived  Other fatigue -     TSH -     Magnesium -     Phosphorus -     High sensitivity CRP -     Homocysteine -     T3, Free -     Cortisol -     DHEA-sulfate -     Anemia Profile B -     Estradiol -     Vitamin B12 -     Folate  Weight loss  Erectile dysfunction, unspecified erectile dysfunction type -     PSA, total and free -     Testosterone,Free and Total  Vitamin D deficiency -     VITAMIN D 25  Hydroxy (Vit-D Deficiency, Fractures)  Hematuria, unspecified type -     Urine Culture -     Urinalysis  Essential hypertension -     CBC with Differential/Platelet -     CMP14+EGFR  I have discontinued Shriyans Pyper's amLODipine, HYDROcodone-acetaminophen, and ondansetron. I am also having him maintain his Accu-Chek Aviva Plus.  Allergies as of 11/14/2018   No Known Allergies     Medication List       Accurate as of November 14, 2018 10:54 AM. Always use your most recent med list.        Accu-Chek Aviva Plus test strip Generic drug:  glucose blood        Follow-up: Return in about 6 weeks (around 12/26/2018).  Claretta Fraise, M.D.

## 2018-11-15 LAB — URINE CULTURE: Organism ID, Bacteria: NO GROWTH

## 2018-11-17 ENCOUNTER — Other Ambulatory Visit: Payer: Medicare Other

## 2018-11-17 ENCOUNTER — Other Ambulatory Visit: Payer: Self-pay

## 2018-11-17 LAB — BAYER DCA HB A1C WAIVED: HB A1C (BAYER DCA - WAIVED): 5.8 % (ref <7.0)

## 2018-11-18 LAB — ANEMIA PROFILE B
Basophils Absolute: 0 10*3/uL (ref 0.0–0.2)
Basos: 0 %
EOS (ABSOLUTE): 0.1 10*3/uL (ref 0.0–0.4)
EOS: 2 %
Ferritin: 65 ng/mL (ref 30–400)
Folate: 9.5 ng/mL (ref >3.0)
Hematocrit: 42.5 % (ref 37.5–51.0)
Hemoglobin: 14.4 g/dL (ref 13.0–17.7)
IMMATURE GRANULOCYTES: 0 %
Immature Grans (Abs): 0 10*3/uL (ref 0.0–0.1)
Iron Saturation: 26 % (ref 15–55)
Iron: 80 ug/dL (ref 38–169)
Lymphocytes Absolute: 1.5 10*3/uL (ref 0.7–3.1)
Lymphs: 32 %
MCH: 30.3 pg (ref 26.6–33.0)
MCHC: 33.9 g/dL (ref 31.5–35.7)
MCV: 90 fL (ref 79–97)
Monocytes Absolute: 0.5 10*3/uL (ref 0.1–0.9)
Monocytes: 10 %
Neutrophils Absolute: 2.6 10*3/uL (ref 1.4–7.0)
Neutrophils: 56 %
Platelets: 140 10*3/uL — ABNORMAL LOW (ref 150–450)
RBC: 4.75 x10E6/uL (ref 4.14–5.80)
RDW: 12.9 % (ref 11.6–15.4)
Retic Ct Pct: 0.9 % (ref 0.6–2.6)
Total Iron Binding Capacity: 312 ug/dL (ref 250–450)
UIBC: 232 ug/dL (ref 111–343)
Vitamin B-12: 275 pg/mL (ref 232–1245)
WBC: 4.7 10*3/uL (ref 3.4–10.8)

## 2018-11-18 LAB — CMP14+EGFR
ALT: 10 IU/L (ref 0–44)
AST: 17 IU/L (ref 0–40)
Albumin/Globulin Ratio: 1.9 (ref 1.2–2.2)
Albumin: 4.3 g/dL (ref 3.6–4.6)
Alkaline Phosphatase: 88 IU/L (ref 39–117)
BUN/Creatinine Ratio: 20 (ref 10–24)
BUN: 19 mg/dL (ref 8–27)
Bilirubin Total: 0.6 mg/dL (ref 0.0–1.2)
CO2: 23 mmol/L (ref 20–29)
Calcium: 9.5 mg/dL (ref 8.6–10.2)
Chloride: 102 mmol/L (ref 96–106)
Creatinine, Ser: 0.95 mg/dL (ref 0.76–1.27)
GFR calc Af Amer: 85 mL/min/{1.73_m2} (ref >59)
GFR calc non Af Amer: 74 mL/min/{1.73_m2} (ref >59)
Globulin, Total: 2.3 g/dL (ref 1.5–4.5)
Glucose: 97 mg/dL (ref 65–99)
Potassium: 4.3 mmol/L (ref 3.5–5.2)
Sodium: 141 mmol/L (ref 134–144)
Total Protein: 6.6 g/dL (ref 6.0–8.5)

## 2018-11-18 LAB — ESTRADIOL: Estradiol: 16.6 pg/mL (ref 7.6–42.6)

## 2018-11-18 LAB — T3, FREE: T3, Free: 2.3 pg/mL (ref 2.0–4.4)

## 2018-11-18 LAB — PSA, TOTAL AND FREE
PROSTATE SPECIFIC AG, SERUM: 4.7 ng/mL — AB (ref 0.0–4.0)
PSA, Free Pct: 11.7 %
PSA, Free: 0.55 ng/mL

## 2018-11-18 LAB — TSH: TSH: 4.86 u[IU]/mL — AB (ref 0.450–4.500)

## 2018-11-18 LAB — HOMOCYSTEINE: Homocysteine: 14.5 umol/L (ref 0.0–21.3)

## 2018-11-18 LAB — TESTOSTERONE,FREE AND TOTAL
Testosterone, Free: 6.6 pg/mL (ref 6.6–18.1)
Testosterone: 529 ng/dL (ref 264–916)

## 2018-11-18 LAB — HIGH SENSITIVITY CRP: CRP, High Sensitivity: 2.19 mg/L (ref 0.00–3.00)

## 2018-11-18 LAB — PHOSPHORUS: Phosphorus: 3.3 mg/dL (ref 2.8–4.1)

## 2018-11-18 LAB — VITAMIN D 25 HYDROXY (VIT D DEFICIENCY, FRACTURES): Vit D, 25-Hydroxy: 21.1 ng/mL — ABNORMAL LOW (ref 30.0–100.0)

## 2018-11-18 LAB — MAGNESIUM: Magnesium: 2.2 mg/dL (ref 1.6–2.3)

## 2018-11-18 LAB — DHEA-SULFATE: DHEA-SO4: 67.5 ug/dL (ref 20.8–226.4)

## 2018-11-18 LAB — CORTISOL: Cortisol: 15.2 ug/dL

## 2018-11-23 LAB — T4: T4, Total: 7.4 ug/dL (ref 4.5–12.0)

## 2018-11-23 LAB — CARDIOLIPIN ANTIBODIES, IGG, IGM, IGA
Anticardiolipin IgA: <9 APL U/mL (ref 0–11)
Anticardiolipin IgG: <9 GPL U/mL (ref 0–14)
Anticardiolipin IgM: <9 MPL U/mL (ref 0–12)

## 2018-11-23 LAB — SPECIMEN STATUS REPORT

## 2018-11-25 ENCOUNTER — Telehealth: Payer: Self-pay | Admitting: Family Medicine

## 2018-11-25 NOTE — Telephone Encounter (Signed)
Miguel Hill is wanting to know results of lab work he had done, Dr Livia Snellen had added additional tests, Miguel Hill is aware that Dr Livia Snellen is out of the office this week, is his covering provider able to review labs if so he would like a call back with these results.

## 2018-11-25 NOTE — Telephone Encounter (Signed)
Patient aware and verbalizes understanding. Please advise

## 2018-11-25 NOTE — Telephone Encounter (Signed)
It appears that Dr Livia Snellen already interpreted these results 11/17/2018.  He added additional labs and I do not see these results.  Will defer to his return next week.

## 2018-12-02 ENCOUNTER — Other Ambulatory Visit: Payer: Self-pay | Admitting: Family Medicine

## 2018-12-02 MED ORDER — LEVOTHYROXINE SODIUM 25 MCG PO TABS: 25.0000 ug | ORAL_TABLET | Freq: Every day | ORAL | 0 refills | Status: DC

## 2018-12-02 MED ORDER — VITAMIN D (ERGOCALCIFEROL) 1.25 MG (50000 UNIT) PO CAPS: 50000.0000 [IU] | ORAL_CAPSULE | ORAL | 3 refills | Status: AC

## 2018-12-02 NOTE — Telephone Encounter (Signed)
Patient also notified per result notes from 11/17/2018 that he has blood in his urine.  He states he has already followed up with Dr. Jeffie Pollock regarding this.

## 2018-12-02 NOTE — Telephone Encounter (Signed)
Patient notified of message about labs from Dr. Livia Snellen, and the prescriptions that were sent to his pharmacy.

## 2018-12-02 NOTE — Telephone Encounter (Signed)
Please contact the patient let him know that we had some delay getting back some of his's.  However they are all available and everything is normal with the exception of his thyroid blood test.  He also has a low vitamin D.    For the thyroid I sent in a very very low dose of medication.  I think this will help his energy.  I kept dose low because of his concerns of being very sensitive to medication.  Please reassure him regarding this.  He should also take a prescription strength vitamin D once weekly.  I sent in that prescription as well.  He had a large number of tests otherwise.  Each of them came back with no evidence for abnormality or cause for his fatigue  Thanks, WS

## 2018-12-09 ENCOUNTER — Telehealth: Payer: Self-pay | Admitting: Family Medicine

## 2018-12-09 NOTE — Telephone Encounter (Signed)
Patient would like results of last blood work. Please review and advise.

## 2018-12-10 ENCOUNTER — Other Ambulatory Visit: Payer: Self-pay | Admitting: *Deleted

## 2018-12-10 DIAGNOSIS — E039 Hypothyroidism, unspecified: Secondary | ICD-10-CM

## 2018-12-10 NOTE — Telephone Encounter (Signed)
Aware of all labs.   Free  T-4 future order was done, per provider,  and patient requests lipids be done at his next blood work.

## 2018-12-10 NOTE — Telephone Encounter (Signed)
I asked for three add on labs in order to interpret the results (see my note on the lab itself) those results have not been received. Please find out if they exist. If they do, forward them to me. If not, the patient will nee to come in to have them redrawn.  Thanks, WS

## 2018-12-16 ENCOUNTER — Telehealth: Payer: Self-pay | Admitting: Family Medicine

## 2019-01-13 ENCOUNTER — Other Ambulatory Visit: Payer: Self-pay

## 2019-01-14 ENCOUNTER — Ambulatory Visit: Payer: Medicare Other | Admitting: Family Medicine

## 2019-01-14 ENCOUNTER — Other Ambulatory Visit: Payer: Self-pay | Admitting: *Deleted

## 2019-01-14 ENCOUNTER — Encounter: Payer: Self-pay | Admitting: Family Medicine

## 2019-01-14 VITALS — BP 172/93 | HR 59 | Temp 97.1°F | Ht 71.0 in | Wt 140.0 lb

## 2019-01-14 DIAGNOSIS — E039 Hypothyroidism, unspecified: Secondary | ICD-10-CM | POA: Diagnosis not present

## 2019-01-14 DIAGNOSIS — I10 Essential (primary) hypertension: Secondary | ICD-10-CM

## 2019-01-14 DIAGNOSIS — R7303 Prediabetes: Secondary | ICD-10-CM

## 2019-01-14 DIAGNOSIS — Z0000 Encounter for general adult medical examination without abnormal findings: Secondary | ICD-10-CM

## 2019-01-14 DIAGNOSIS — R5383 Other fatigue: Secondary | ICD-10-CM

## 2019-01-14 LAB — URINALYSIS
Bilirubin, UA: NEGATIVE
Glucose, UA: NEGATIVE
Ketones, UA: NEGATIVE
Leukocytes,UA: NEGATIVE
Nitrite, UA: NEGATIVE
Protein,UA: NEGATIVE
Specific Gravity, UA: 1.025 (ref 1.005–1.030)
Urobilinogen, Ur: 0.2 mg/dL (ref 0.2–1.0)
pH, UA: 5.5 (ref 5.0–7.5)

## 2019-01-14 LAB — LIPID PANEL

## 2019-01-14 MED ORDER — SILDENAFIL CITRATE 20 MG PO TABS: ORAL_TABLET | ORAL | 2 refills | Status: DC

## 2019-01-14 NOTE — Telephone Encounter (Signed)
Aware. Left message.

## 2019-01-14 NOTE — Telephone Encounter (Signed)
Fax from [facility] RF request for Sildenafil 20 mg 2-5 tab qd for ED Not on current med list, last RF 12/16/17 Please advise

## 2019-01-14 NOTE — Progress Notes (Addendum)
Subjective:  Patient ID: Miguel Hill, male    DOB: 1933/06/16  Age: 85 y.o. MRN: [id number]  CC: Annual Exam   HPI Miguel Hill presents for annual physical examination.  He is still very frustrated about ongoing weakness and fatigue.  He reiterates that this started after his BCG treatments.  He is had fatigue ever since those were done a couple months ago.  He had a normal cystoscopy in April of this year.  He tells me now that he is also lost his sense of taste and smell since her recent illness.  Other concerns include a slow heart rate in the 50s.  He tells me that he took 1 thyroid tablet and it made him feel wiped out the next day so he discontinued it.  He has problems with constipation and occasional blurred vision as well.  Depression screen [facility] 2/9 01/14/2019 11/14/2018 08/04/2018  Decreased Interest 0 0 0  Down, Depressed, Hopeless 0 0 0  PHQ - 2 Score 0 0 0  Altered sleeping - - -  Tired, decreased energy - - -  Change in appetite - - -  Feeling bad or failure about yourself  - - -  Trouble concentrating - - -  Moving slowly or fidgety/restless - - -  Suicidal thoughts - - -  PHQ-9 Score - - -    History Miguel Hill has a past medical history of Hypertension, PONV (postoperative nausea and vomiting), and Prediabetes.   He has a past surgical history that includes Hernia repair (Bilateral) and Transurethral resection of bladder tumor with mitomycin-c (N/A, 07/25/2018).   His family history includes Heart attack in his father.He reports that he quit smoking about 33 years ago. His smokeless tobacco use includes chew. He reports that he does not drink alcohol or use drugs.    ROS Review of Systems  Constitutional: Positive for fatigue. Negative for activity change and unexpected weight change.  HENT: Negative for congestion, ear pain, hearing loss, postnasal drip and trouble swallowing.   Eyes: Negative for pain and visual disturbance.  Respiratory: Negative for cough,  chest tightness and shortness of breath.   Cardiovascular: Negative for chest pain, palpitations and leg swelling.  Gastrointestinal: Negative for abdominal distention, abdominal pain, blood in stool, constipation, diarrhea, nausea and vomiting.  Endocrine: Negative for cold intolerance, heat intolerance and polydipsia.  Genitourinary: Negative for difficulty urinating, dysuria, flank pain, frequency and urgency.  Musculoskeletal: Positive for arthralgias (right knee, chronic). Negative for joint swelling.  Skin: Negative for color change, rash and wound.  Neurological: Positive for weakness (Nonfocal, generalized). Negative for dizziness, syncope, speech difficulty, light-headedness, numbness and headaches.  Hematological: Does not bruise/bleed easily.  Psychiatric/Behavioral: Negative for confusion, decreased concentration, dysphoric mood and sleep disturbance. The patient is not nervous/anxious.     Objective:  BP (!) 172/93   Pulse (!) 59   Temp (!) 97.1 F (36.2 C) (Oral)   Ht '5\' 11"'$  (1.803 m)   Wt 140 lb (63.5 kg)   BMI 19.53 kg/m   BP Readings from Last 3 Encounters:  01/14/19 (!) 172/93  11/14/18 (!) 189/81  08/05/18 (!) 162/84    Wt Readings from Last 3 Encounters:  01/14/19 140 lb (63.5 kg)  11/14/18 142 lb 4 oz (64.5 kg)  08/05/18 150 lb (68 kg)     Physical Exam Constitutional:      Appearance: He is well-developed.  HENT:     Head: Normocephalic and atraumatic.  Eyes:     Pupils:  Pupils are equal, round, and reactive to light.  Neck:     Musculoskeletal: Normal range of motion.     Thyroid: No thyromegaly.     Trachea: No tracheal deviation.  Cardiovascular:     Rate and Rhythm: Normal rate and regular rhythm.     Heart sounds: Normal heart sounds. No murmur. No friction rub. No gallop.   Pulmonary:     Breath sounds: Normal breath sounds. No wheezing or rales.  Abdominal:     General: Bowel sounds are normal. There is no distension.     Palpations:  Abdomen is soft. There is no mass.     Tenderness: There is no abdominal tenderness.     Hernia: There is no hernia in the right inguinal area or left inguinal area.  Genitourinary:    Penis: Normal.      Scrotum/Testes: Normal.  Musculoskeletal: Normal range of motion.  Lymphadenopathy:     Cervical: No cervical adenopathy.  Skin:    General: Skin is warm and dry.  Neurological:     Mental Status: He is alert and oriented to person, place, and time.       Assessment & Plan:   Miguel Hill was seen today for annual exam.  Diagnoses and all orders for this visit:  Hypothyroidism, unspecified type -     T4, free; Future -     Thyroid Peroxidase Antibodies (TPO) (REFL) -     Thyroglobulin antibody -     Thyroid Peroxidase Antibody  Other fatigue -     CBC with Differential/Platelet -     CMP14+EGFR -     T4, free; Future -     C-peptide -     Thyroid Peroxidase Antibodies (TPO) (REFL) -     Thyroglobulin antibody -     Thyroid Peroxidase Antibody -     Zinc -     Insulin, random -     CT HEART W/O CONTRAST MEDIA - QUANT EVAL CORONARY CALCIUM  Annual physical exam -     CBC with Differential/Platelet -     CMP14+EGFR -     Lipid panel -     Urinalysis -     T4, free; Future -     C-peptide -     Thyroid Peroxidase Antibodies (TPO) (REFL) -     Thyroglobulin antibody -     Thyroid Peroxidase Antibody -     CT HEART W/O CONTRAST MEDIA - QUANT EVAL CORONARY CALCIUM  Prediabetes -     Insulin, random  Well adult exam  Essential hypertension  Other orders -     Discontinue: sildenafil (REVATIO) 20 MG tablet; Take 2 to 5 pills once daily as needed for erectile dysfunction       I am having [name] maintain his levothyroxine and Vitamin D (Ergocalciferol).  Allergies as of 01/14/2019   No Known Allergies     Medication List       Accurate as of Jan 14, 2019 11:59 PM. If you have any questions, ask your nurse or doctor.        levothyroxine 25 MCG  tablet Commonly known as:  SYNTHROID Take 1 tablet (25 mcg total) by mouth daily.   sildenafil 20 MG tablet Commonly known as:  Revatio Take 2 to 5 pills once daily as needed for erectile dysfunction Started by:  Lucienne Minks, LPN   Vitamin D (Ergocalciferol) 1.25 MG (50000 UT) Caps capsule Commonly known as:  DRISDOL Take  1 capsule (50,000 Units total) by mouth every 7 (seven) days.      Noncompliant regarding thyroid and hypertension treatment.  Follow-up: Return in about 6 weeks (around 02/25/2019).  Claretta Fraise, M.D.

## 2019-01-14 NOTE — Telephone Encounter (Signed)
I sent in the requested prescription 

## 2019-01-15 LAB — CBC WITH DIFFERENTIAL/PLATELET
Basophils Absolute: 0 10*3/uL (ref 0.0–0.2)
Basos: 0 %
EOS (ABSOLUTE): 0.1 10*3/uL (ref 0.0–0.4)
Eos: 1 %
Hematocrit: 42.2 % (ref 37.5–51.0)
Hemoglobin: 14.4 g/dL (ref 13.0–17.7)
Immature Grans (Abs): 0 10*3/uL (ref 0.0–0.1)
Immature Granulocytes: 0 %
Lymphocytes Absolute: 1.3 10*3/uL (ref 0.7–3.1)
Lymphs: 26 %
MCH: 29.3 pg (ref 26.6–33.0)
MCHC: 34.1 g/dL (ref 31.5–35.7)
MCV: 86 fL (ref 79–97)
Monocytes Absolute: 0.4 10*3/uL (ref 0.1–0.9)
Monocytes: 9 %
Neutrophils Absolute: 3.2 10*3/uL (ref 1.4–7.0)
Neutrophils: 64 %
Platelets: 143 10*3/uL — ABNORMAL LOW (ref 150–450)
RBC: 4.92 x10E6/uL (ref 4.14–5.80)
RDW: 12.8 % (ref 11.6–15.4)
WBC: 5 10*3/uL (ref 3.4–10.8)

## 2019-01-15 LAB — CMP14+EGFR
ALT: 10 IU/L (ref 0–44)
AST: 19 IU/L (ref 0–40)
Albumin/Globulin Ratio: 1.7 (ref 1.2–2.2)
Albumin: 4.1 g/dL (ref 3.6–4.6)
Alkaline Phosphatase: 82 IU/L (ref 39–117)
BUN/Creatinine Ratio: 18 (ref 10–24)
BUN: 17 mg/dL (ref 8–27)
Bilirubin Total: 0.6 mg/dL (ref 0.0–1.2)
CO2: 22 mmol/L (ref 20–29)
Calcium: 9.3 mg/dL (ref 8.6–10.2)
Chloride: 103 mmol/L (ref 96–106)
Creatinine, Ser: 0.97 mg/dL (ref 0.76–1.27)
GFR calc Af Amer: 83 mL/min/{1.73_m2} (ref >59)
GFR calc non Af Amer: 72 mL/min/{1.73_m2} (ref >59)
Globulin, Total: 2.4 g/dL (ref 1.5–4.5)
Glucose: 96 mg/dL (ref 65–99)
Potassium: 4.2 mmol/L (ref 3.5–5.2)
Sodium: 140 mmol/L (ref 134–144)
Total Protein: 6.5 g/dL (ref 6.0–8.5)

## 2019-01-15 LAB — LIPID PANEL
Chol/HDL Ratio: 2.8 ratio (ref 0.0–5.0)
Cholesterol, Total: 188 mg/dL (ref 100–199)
HDL: 68 mg/dL
LDL Calculated: 109 mg/dL — ABNORMAL HIGH (ref 0–99)
Triglycerides: 57 mg/dL (ref 0–149)
VLDL Cholesterol Cal: 11 mg/dL (ref 5–40)

## 2019-01-15 LAB — THYROGLOBULIN ANTIBODY: Thyroglobulin Antibody: <1 IU/mL (ref 0.0–0.9)

## 2019-01-15 LAB — C-PEPTIDE: C-Peptide: 1.5 ng/mL (ref 1.1–4.4)

## 2019-01-15 LAB — THYROID PEROXIDASE ANTIBODY: Thyroperoxidase Ab SerPl-aCnc: 9 [IU]/mL (ref 0–34)

## 2019-01-16 MED ORDER — SILDENAFIL CITRATE 20 MG PO TABS: ORAL_TABLET | ORAL | 2 refills | Status: DC

## 2019-01-16 NOTE — Telephone Encounter (Signed)
Refill request from 01/14/19 was on print instead on normal to go electronic Please resend to [facility]

## 2019-01-16 NOTE — Addendum Note (Signed)
Addended by: Antonietta Barcelona D on: 01/16/2019 11:40 AM   Modules accepted: Orders

## 2019-02-10 ENCOUNTER — Other Ambulatory Visit: Payer: Self-pay

## 2019-02-11 ENCOUNTER — Encounter: Payer: Self-pay | Admitting: Family Medicine

## 2019-02-11 ENCOUNTER — Telehealth: Payer: Self-pay | Admitting: Family Medicine

## 2019-02-11 ENCOUNTER — Ambulatory Visit: Payer: Medicare Other | Admitting: Family Medicine

## 2019-02-11 VITALS — BP 187/85 | HR 59 | Temp 97.5°F | Ht 71.0 in | Wt 141.0 lb

## 2019-02-11 DIAGNOSIS — Z122 Encounter for screening for malignant neoplasm of respiratory organs: Secondary | ICD-10-CM

## 2019-02-11 DIAGNOSIS — Z1212 Encounter for screening for malignant neoplasm of rectum: Secondary | ICD-10-CM

## 2019-02-11 DIAGNOSIS — R5382 Chronic fatigue, unspecified: Secondary | ICD-10-CM

## 2019-02-11 DIAGNOSIS — M4807 Spinal stenosis, lumbosacral region: Secondary | ICD-10-CM | POA: Diagnosis not present

## 2019-02-11 DIAGNOSIS — R430 Anosmia: Secondary | ICD-10-CM

## 2019-02-11 NOTE — Telephone Encounter (Signed)
Check with clerical staff. I sent it to be scanned.

## 2019-02-11 NOTE — Telephone Encounter (Signed)
Patient would like the copy of the CT report that he brought you this morning mailed back to him.

## 2019-02-11 NOTE — Progress Notes (Signed)
Subjective:  Patient ID: Miguel Hill, male    DOB: 04/18/1933  Age: 85 y.o. MRN: [id number]  CC: Medical Management of Chronic Issues   HPI Miguel Hill presents for recheck of his fatigue. E also has weakness. Noted mild improvement with thyroid med. Brings in several requeests for tests to be run because Miguel Hill read about them as possible causes of his condition. We had discussed some of these previously as not being medically essential, but Miguel Hill tells me today Miguel Hill wants them run anyway.   Depression screen [facility] 2/9 02/11/2019 01/14/2019 11/14/2018  Decreased Interest 0 0 0  Down, Depressed, Hopeless 0 0 0  PHQ - 2 Score 0 0 0  Altered sleeping - - -  Tired, decreased energy - - -  Change in appetite - - -  Feeling bad or failure about yourself  - - -  Trouble concentrating - - -  Moving slowly or fidgety/restless - - -  Suicidal thoughts - - -  PHQ-9 Score - - -    History Miguel Hill has a past medical history of Hypertension, PONV (postoperative nausea and vomiting), and Prediabetes.   Miguel Hill has a past surgical history that includes Hernia repair (Bilateral) and Transurethral resection of bladder tumor with mitomycin-c (N/A, 07/25/2018).   His family history includes Heart attack in his father.Miguel Hill reports that Miguel Hill quit smoking about 33 years ago. His smokeless tobacco use includes chew. Miguel Hill reports that Miguel Hill does not drink alcohol or use drugs.    ROS Review of Systems  Constitutional: Positive for fatigue. Negative for appetite change, chills, diaphoresis and fever.  HENT: Negative.   Eyes: Negative for visual disturbance.  Respiratory: Negative for cough and shortness of breath.   Cardiovascular: Negative for chest pain and leg swelling.  Gastrointestinal: Negative for abdominal pain, diarrhea, nausea and vomiting.  Genitourinary: Negative for difficulty urinating.  Musculoskeletal: Negative for arthralgias and myalgias.  Skin: Negative for rash.  Neurological: Negative for headaches.   Psychiatric/Behavioral: Negative for sleep disturbance.    Objective:  BP (!) 187/85   Pulse (!) 59   Temp (!) 97.5 F (36.4 C) (Oral)   Ht 5\' 11"  (1.803 m)   Wt 141 lb (64 kg)   BMI 19.67 kg/m   BP Readings from Last 3 Encounters:  02/11/19 (!) 187/85  01/14/19 (!) 172/93  11/14/18 (!) 189/81    Wt Readings from Last 3 Encounters:  02/11/19 141 lb (64 kg)  01/14/19 140 lb (63.5 kg)  11/14/18 142 lb 4 oz (64.5 kg)     Physical Exam Constitutional:      General: Miguel Hill is not in acute distress.    Appearance: Miguel Hill is well-developed. Miguel Hill is not ill-appearing or toxic-appearing.  HENT:     Head: Normocephalic and atraumatic.     Right Ear: External ear normal.     Left Ear: External ear normal.     Nose: Nose normal.  Eyes:     Conjunctiva/sclera: Conjunctivae normal.     Pupils: Pupils are equal, round, and reactive to light.  Neck:     Musculoskeletal: Normal range of motion and neck supple.  Cardiovascular:     Rate and Rhythm: Normal rate and regular rhythm.     Heart sounds: Normal heart sounds. No murmur.  Pulmonary:     Effort: Pulmonary effort is normal. No respiratory distress.     Breath sounds: Normal breath sounds. No wheezing or rales.  Abdominal:     Palpations: Abdomen is soft.  Tenderness: There is no abdominal tenderness.  Musculoskeletal: Normal range of motion.  Skin:    General: Skin is warm and dry.  Neurological:     Mental Status: Miguel Hill is alert and oriented to person, place, and time.     Deep Tendon Reflexes: Reflexes are normal and symmetric.  Psychiatric:        Behavior: Behavior normal.        Thought Content: Thought content normal.        Judgment: Judgment normal.       Assessment & Plan:   Miguel Hill was seen today for medical management of chronic issues.  Diagnoses and all orders for this visit:  Chronic fatigue -     Zinc protoporphyrin -     Zinc -     Selenium serum -     Insulin and C-Peptide -     Ambulatory  referral to Endocrinology -     Ambulatory referral to Neurology  Encounter for screening for malignant neoplasm of respiratory organs -     CT CHEST LUNG CANCER SCREENING LOW DOSE WO CONTRAST; Future  Spinal stenosis of lumbosacral region -     MR Lumbar Spine Wo Contrast; Future  Loss of sense of smell -     Ambulatory referral to Endocrinology -     Ambulatory referral to Neurology  Screening for malignant neoplasm of the rectum -     Fecal occult blood, imunochemical; Future -     Fecal occult blood, imunochemical  Other orders -     Zinc Protoporphyrin (ZPP)       I am having [name] maintain his levothyroxine, Vitamin D (Ergocalciferol), and sildenafil.  Allergies as of 02/11/2019   No Known Allergies     Medication List       Accurate as of February 11, 2019 11:59 PM. If you have any questions, ask your nurse or doctor.        levothyroxine 25 MCG tablet Commonly known as: SYNTHROID Take 1 tablet (25 mcg total) by mouth daily.   sildenafil 20 MG tablet Commonly known as: Revatio Take 2 to 5 pills once daily as needed for erectile dysfunction   Vitamin D (Ergocalciferol) 1.25 MG (50000 UT) Caps capsule Commonly known as: DRISDOL Take 1 capsule (50,000 Units total) by mouth every 7 (seven) days.        Follow-up: Return in about 6 months (around 08/13/2019).  Claretta Fraise, M.D.

## 2019-02-12 LAB — FECAL OCCULT BLOOD, IMMUNOCHEMICAL: Fecal Occult Bld: NEGATIVE

## 2019-02-12 NOTE — Telephone Encounter (Signed)
Document was sent to scan. Awaiting chart review and I will return copy to patient

## 2019-02-13 LAB — INSULIN AND C-PEPTIDE, SERUM
C-Peptide: 1.7 ng/mL (ref 1.1–4.4)
INSULIN: 6.1 u[IU]/mL (ref 2.6–24.9)

## 2019-02-13 LAB — ZINC: Zinc: 71 ug/dL (ref 56–134)

## 2019-02-13 LAB — ZINC PROTOPORPHYRIN (ZPP): Zinc Protoporphyrin (ZPP): 22 ug/dL (ref 0–36)

## 2019-02-13 LAB — SELENIUM SERUM: Selenium, S/P: 118 ug/L (ref 91–198)

## 2019-02-13 NOTE — Progress Notes (Signed)
Hello Miguel Hill,  Your lab result is normal.Some minor variations that are not significant are commonly marked abnormal, but do not represent any medical problem for you.  Best regards, Aeron Lheureux, M.D.

## 2019-02-16 NOTE — Progress Notes (Signed)
Hello Marshal,  Your lab result is normal.Some minor variations that are not significant are commonly marked abnormal, but do not represent any medical problem for you.  Best regards, Irvin Lizama, M.D.

## 2019-02-19 ENCOUNTER — Ambulatory Visit (HOSPITAL_COMMUNITY)
Admission: RE | Admit: 2019-02-19 | Discharge: 2019-02-19 | Disposition: A | Discharge status: Home / Self Care | Payer: Medicare Other | Source: Ambulatory Visit | Attending: Family Medicine | Admitting: Family Medicine

## 2019-02-19 ENCOUNTER — Other Ambulatory Visit: Payer: Self-pay

## 2019-02-19 DIAGNOSIS — M4807 Spinal stenosis, lumbosacral region: Secondary | ICD-10-CM | POA: Diagnosis present

## 2019-02-20 ENCOUNTER — Other Ambulatory Visit: Payer: Self-pay | Admitting: *Deleted

## 2019-02-20 DIAGNOSIS — M4807 Spinal stenosis, lumbosacral region: Secondary | ICD-10-CM

## 2019-02-20 DIAGNOSIS — G589 Mononeuropathy, unspecified: Secondary | ICD-10-CM

## 2019-02-22 ENCOUNTER — Encounter: Payer: Self-pay | Admitting: Family Medicine

## 2019-02-25 ENCOUNTER — Ambulatory Visit: Payer: Medicare Other | Admitting: Family Medicine

## 2019-02-26 ENCOUNTER — Ambulatory Visit (INDEPENDENT_AMBULATORY_CARE_PROVIDER_SITE_OTHER): Payer: Medicare Other | Admitting: Family Medicine

## 2019-02-26 ENCOUNTER — Other Ambulatory Visit: Payer: Self-pay

## 2019-02-26 ENCOUNTER — Encounter: Payer: Self-pay | Admitting: Family Medicine

## 2019-02-26 DIAGNOSIS — F5101 Primary insomnia: Secondary | ICD-10-CM | POA: Diagnosis not present

## 2019-02-26 MED ORDER — ZOLPIDEM TARTRATE 1.75 MG SL SUBL: SUBLINGUAL_TABLET | SUBLINGUAL | 2 refills | Status: DC

## 2019-02-26 NOTE — Progress Notes (Signed)
Subjective:    Patient ID: Miguel Hill, male    DOB: [birth year], 85 y.o.   MRN: [id number]   HPI: Miguel Hill is a 85 y.o. male presenting for waking up in the middle  Of the night and being unable to get back to sleep. Has used intermezzo in the past (low dose) with good results. Asks for scrip today. [place] doing well. Denies excessive snoring. Overall, sleep is restorative.   Depression screen [facility] 2/9 02/11/2019 01/14/2019 11/14/2018 08/04/2018 12/30/2017  Decreased Interest 0 0 0 0 0  Down, Depressed, Hopeless 0 0 0 0 0  PHQ - 2 Score 0 0 0 0 0  Altered sleeping - - - - -  Tired, decreased energy - - - - -  Change in appetite - - - - -  Feeling bad or failure about yourself  - - - - -  Trouble concentrating - - - - -  Moving slowly or fidgety/restless - - - - -  Suicidal thoughts - - - - -  PHQ-9 Score - - - - -     Relevant past medical, surgical, family and social history reviewed and updated as indicated.  Interim medical history since our last visit reviewed. Allergies and medications reviewed and updated.  ROS:  Review of Systems  Constitutional: Negative for fever.  Respiratory: Negative for shortness of breath.   Cardiovascular: Negative for chest pain.  Musculoskeletal: Negative for arthralgias.  Skin: Negative for rash.     Social History   Tobacco Use  Smoking Status Former Smoker  . Quit date: 04/03/1985  . Years since quitting: 33.9  Smokeless Tobacco Current User  . Types: Chew  Tobacco Comment   rarely uses chewing tobacco       Objective:     Wt Readings from Last 3 Encounters:  02/11/19 141 lb (64 kg)  01/14/19 140 lb (63.5 kg)  11/14/18 142 lb 4 oz (64.5 kg)     Exam deferred. Pt. Harboring due to COVID 19. Phone visit performed.   Assessment & Plan:   1. Primary insomnia     Meds ordered this encounter  Medications  . Zolpidem Tartrate (INTERMEZZO) 1.75 MG SUBL    Sig: One during the night as needed for sleep.   Dispense:  15 tablet    Refill:  2    No orders of the defined types were placed in this encounter.     Diagnoses and all orders for this visit:  Primary insomnia  Other orders -     Zolpidem Tartrate (INTERMEZZO) 1.75 MG SUBL; One during the night as needed for sleep.    Virtual Visit via telephone Note  I discussed the limitations, risks, security and privacy concerns of performing an evaluation and management service by telephone and the availability of in person appointments. The patient was identified with two identifiers. Pt.expressed understanding and agreed to proceed. Pt. Is at home. Dr. Livia Snellen is in his office.  Follow Up Instructions:   I discussed the assessment and treatment plan with the patient. The patient was provided an opportunity to ask questions and all were answered. The patient agreed with the plan and demonstrated an understanding of the instructions.   The patient was advised to call back or seek an in-person evaluation if the symptoms worsen or if the condition fails to improve as anticipated.   Total minutes including chart review and phone contact time: 17   Follow up plan: Return if symptoms worsen or  fail to improve.  Claretta Fraise, MD [place]

## 2019-03-11 ENCOUNTER — Ambulatory Visit: Payer: Medicare Other

## 2019-08-24 ENCOUNTER — Other Ambulatory Visit: Payer: Self-pay

## 2019-08-25 ENCOUNTER — Ambulatory Visit: Payer: Medicare Other | Admitting: Family Medicine

## 2019-08-25 ENCOUNTER — Encounter: Payer: Self-pay | Admitting: Family Medicine

## 2019-08-25 VITALS — BP 162/88 | HR 69 | Temp 97.7°F | Ht 71.0 in | Wt 140.6 lb

## 2019-08-25 DIAGNOSIS — E538 Deficiency of other specified B group vitamins: Secondary | ICD-10-CM | POA: Diagnosis not present

## 2019-08-25 DIAGNOSIS — N529 Male erectile dysfunction, unspecified: Secondary | ICD-10-CM | POA: Diagnosis not present

## 2019-08-25 DIAGNOSIS — R5383 Other fatigue: Secondary | ICD-10-CM | POA: Diagnosis not present

## 2019-08-25 DIAGNOSIS — E559 Vitamin D deficiency, unspecified: Secondary | ICD-10-CM

## 2019-08-25 DIAGNOSIS — E038 Other specified hypothyroidism: Secondary | ICD-10-CM | POA: Diagnosis not present

## 2019-08-25 DIAGNOSIS — G479 Sleep disorder, unspecified: Secondary | ICD-10-CM

## 2019-08-25 NOTE — Progress Notes (Signed)
Subjective:  Patient ID: Miguel Hill, male    DOB: Aug 07, 1933  Age: 86 y.o. MRN: [id number]  CC: Medical Management of Chronic Issues   HPI  Miguel Hill presents for poor energy. Off of B12 shots and thyroid meds.Requests a specific battery of blood tests - as below. Miguel Hill is able to do all necessary activities through the day, but has to force himself and no energy is left overfor other activities. Miguel Hill is also awakening very early. No problem getting to sleep, but awakens without a full nights sleep. Sometimes just 3-4 hours.     Depression screen [facility] 2/9 08/25/2019 02/11/2019 01/14/2019  Decreased Interest 0 0 0  Down, Depressed, Hopeless 0 0 0  PHQ - 2 Score 0 0 0  Altered sleeping - - -  Tired, decreased energy - - -  Change in appetite - - -  Feeling bad or failure about yourself  - - -  Trouble concentrating - - -  Moving slowly or fidgety/restless - - -  Suicidal thoughts - - -  PHQ-9 Score - - -    History Miguel Hill has a past medical history of Hypertension, PONV (postoperative nausea and vomiting), and Prediabetes.   Miguel Hill has a past surgical history that includes Hernia repair (Bilateral) and Transurethral resection of bladder tumor with mitomycin-c (N/A, 07/25/2018).   His family history includes Heart attack in his father.Miguel Hill reports that Miguel Hill quit smoking about 34 years ago. His smokeless tobacco use includes chew. Miguel Hill reports that Miguel Hill does not drink alcohol or use drugs.    ROS Review of Systems  Constitutional: Positive for fatigue.  HENT: Negative.   Eyes: Negative for visual disturbance.  Respiratory: Negative for cough and shortness of breath.   Cardiovascular: Negative for chest pain and leg swelling.  Gastrointestinal: Negative for abdominal pain, diarrhea, nausea and vomiting.  Genitourinary: Negative for difficulty urinating.  Musculoskeletal: Negative for arthralgias and myalgias.  Skin: Negative for rash.  Neurological: Positive for weakness (in legs.  bilateral without paresis. Miguel Hill is ambulatory). Negative for headaches.  Psychiatric/Behavioral: Negative for sleep disturbance.    Objective:  BP (!) 162/88   Pulse 69   Temp 97.7 F (36.5 C) (Temporal)   Ht 5\' 11"  (1.803 m)   Wt 140 lb 9.6 oz (63.8 kg)   SpO2 99%   BMI 19.61 kg/m   BP Readings from Last 3 Encounters:  08/25/19 (!) 162/88  02/11/19 (!) 187/85  01/14/19 (!) 172/93    Wt Readings from Last 3 Encounters:  08/25/19 140 lb 9.6 oz (63.8 kg)  02/11/19 141 lb (64 kg)  01/14/19 140 lb (63.5 kg)     Physical Exam Vitals reviewed.  Constitutional:      Appearance: Miguel Hill is well-developed.  HENT:     Head: Normocephalic and atraumatic.     Right Ear: External ear normal.     Left Ear: External ear normal.     Mouth/Throat:     Pharynx: No oropharyngeal exudate or posterior oropharyngeal erythema.  Eyes:     Pupils: Pupils are equal, round, and reactive to light.  Cardiovascular:     Rate and Rhythm: Normal rate and regular rhythm.     Heart sounds: No murmur.  Pulmonary:     Effort: No respiratory distress.     Breath sounds: Normal breath sounds.  Musculoskeletal:     Cervical back: Normal range of motion and neck supple.  Neurological:     Mental Status: Miguel Hill is alert and oriented  to person, place, and time.    Results for orders placed or performed in visit on 08/25/19  CBC  Result Value Ref Range   WBC 5.2 3.4 - 10.8 x10E3/uL   RBC 4.77 4.14 - 5.80 x10E6/uL   Hemoglobin 14.4 13.0 - 17.7 g/dL   Hematocrit 41.5 37.5 - 51.0 %   MCV 87 79 - 97 fL   MCH 30.2 26.6 - 33.0 pg   MCHC 34.7 31.5 - 35.7 g/dL   RDW 12.4 11.6 - 15.4 %   Platelets 139 (L) 150 - 450 x10E3/uL   Neutrophils 67 Not Estab. %   Lymphs 23 Not Estab. %   Monocytes 9 Not Estab. %   Eos 1 Not Estab. %   Basos 0 Not Estab. %   Neutrophils Absolute 3.5 1.4 - 7.0 x10E3/uL   Lymphocytes Absolute 1.2 0.7 - 3.1 x10E3/uL   Monocytes Absolute 0.5 0.1 - 0.9 x10E3/uL   EOS (ABSOLUTE) 0.0 0.0 -  0.4 x10E3/uL   Basophils Absolute 0.0 0.0 - 0.2 x10E3/uL   Immature Granulocytes 0 Not Estab. %   Immature Grans (Abs) 0.0 0.0 - 0.1 x10E3/uL  CMP  Result Value Ref Range   Glucose 110 (H) 65 - 99 mg/dL   BUN 21 8 - 27 mg/dL   Creatinine, Ser 0.87 0.76 - 1.27 mg/dL   GFR calc non Af Amer 79 >59 mL/min/1.73   GFR calc Af Amer 92 >59 mL/min/1.73   BUN/Creatinine Ratio 24 10 - 24   Sodium 142 134 - 144 mmol/L   Potassium 4.2 3.5 - 5.2 mmol/L   Chloride 106 96 - 106 mmol/L   CO2 24 20 - 29 mmol/L   Calcium 9.2 8.6 - 10.2 mg/dL   Total Protein 6.5 6.0 - 8.5 g/dL   Albumin 4.1 3.6 - 4.6 g/dL   Globulin, Total 2.4 1.5 - 4.5 g/dL   Albumin/Globulin Ratio 1.7 1.2 - 2.2   Bilirubin Total 0.4 0.0 - 1.2 mg/dL   Alkaline Phosphatase 94 39 - 117 IU/L   AST 19 0 - 40 IU/L   ALT 11 0 - 44 IU/L  TSH + free T4  Result Value Ref Range   TSH 3.070 0.450 - 4.500 uIU/mL   Free T4 1.01 0.82 - 1.77 ng/dL  VITAMIN D  Result Value Ref Range   Vit D, 25-Hydroxy 35.4 30.0 - 100.0 ng/mL  Folate  Result Value Ref Range   Folate 11.4 >3.0 ng/mL  Vitamin B12  Result Value Ref Range   Vitamin B-12 575 232 - 1,245 pg/mL  T3, Free  Result Value Ref Range   T3, Free 2.3 2.0 - 4.4 pg/mL      Assessment & Plan:   Miguel Hill was seen today for medical management of chronic issues.  Diagnoses and all orders for this visit:  Other specified hypothyroidism -     CBC -     CMP -     TSH + free T4 -     T3, Free  Erectile dysfunction, unspecified erectile dysfunction type -     Testosterone  Fatigue due to sleep pattern disturbance  Vitamin B12 deficiency -     Folate -     Vitamin B12  Vitamin D deficiency -     VITAMIN D       I have discontinued Miguel Hill's sildenafil and Zolpidem Tartrate. I am also having him maintain his levothyroxine and Vitamin D (Ergocalciferol).  Allergies as of 08/25/2019   No  Known Allergies     Medication List       Accurate as of August 25, 2019  11:59 PM. If you have any questions, ask your nurse or doctor.        STOP taking these medications   sildenafil 20 MG tablet Commonly known as: Revatio Stopped by: Claretta Fraise, MD   Zolpidem Tartrate 1.75 MG Subl Commonly known as: Intermezzo Stopped by: Claretta Fraise, MD     TAKE these medications   levothyroxine 25 MCG tablet Commonly known as: SYNTHROID Take 1 tablet (25 mcg total) by mouth daily.   Vitamin D (Ergocalciferol) 1.25 MG (50000 UNIT) Caps capsule Commonly known as: DRISDOL Take 1 capsule (50,000 Units total) by mouth every 7 (seven) days.        Follow-up: Return in about 6 months (around 02/22/2020).  Claretta Fraise, M.D.

## 2019-08-26 LAB — CBC WITH DIFFERENTIAL/PLATELET
Basophils Absolute: 0 10*3/uL (ref 0.0–0.2)
Basos: 0 %
EOS (ABSOLUTE): 0 10*3/uL (ref 0.0–0.4)
Eos: 1 %
Hematocrit: 41.5 % (ref 37.5–51.0)
Hemoglobin: 14.4 g/dL (ref 13.0–17.7)
Immature Grans (Abs): 0 10*3/uL (ref 0.0–0.1)
Immature Granulocytes: 0 %
Lymphocytes Absolute: 1.2 10*3/uL (ref 0.7–3.1)
Lymphs: 23 %
MCH: 30.2 pg (ref 26.6–33.0)
MCHC: 34.7 g/dL (ref 31.5–35.7)
MCV: 87 fL (ref 79–97)
Monocytes Absolute: 0.5 10*3/uL (ref 0.1–0.9)
Monocytes: 9 %
Neutrophils Absolute: 3.5 10*3/uL (ref 1.4–7.0)
Neutrophils: 67 %
Platelets: 139 10*3/uL — ABNORMAL LOW (ref 150–450)
RBC: 4.77 x10E6/uL (ref 4.14–5.80)
RDW: 12.4 % (ref 11.6–15.4)
WBC: 5.2 10*3/uL (ref 3.4–10.8)

## 2019-08-26 LAB — CMP14+EGFR
ALT: 11 IU/L (ref 0–44)
AST: 19 IU/L (ref 0–40)
Albumin/Globulin Ratio: 1.7 (ref 1.2–2.2)
Albumin: 4.1 g/dL (ref 3.6–4.6)
Alkaline Phosphatase: 94 IU/L (ref 39–117)
BUN/Creatinine Ratio: 24 (ref 10–24)
BUN: 21 mg/dL (ref 8–27)
Bilirubin Total: 0.4 mg/dL (ref 0.0–1.2)
CO2: 24 mmol/L (ref 20–29)
Calcium: 9.2 mg/dL (ref 8.6–10.2)
Chloride: 106 mmol/L (ref 96–106)
Creatinine, Ser: 0.87 mg/dL (ref 0.76–1.27)
GFR calc Af Amer: 92 mL/min/{1.73_m2} (ref >59)
GFR calc non Af Amer: 79 mL/min/{1.73_m2} (ref >59)
Globulin, Total: 2.4 g/dL (ref 1.5–4.5)
Glucose: 110 mg/dL — ABNORMAL HIGH (ref 65–99)
Potassium: 4.2 mmol/L (ref 3.5–5.2)
Sodium: 142 mmol/L (ref 134–144)
Total Protein: 6.5 g/dL (ref 6.0–8.5)

## 2019-08-26 LAB — FOLATE: Folate: 11.4 ng/mL (ref >3.0)

## 2019-08-26 LAB — TSH+FREE T4
Free T4: 1.01 ng/dL (ref 0.82–1.77)
TSH: 3.07 u[IU]/mL (ref 0.450–4.500)

## 2019-08-26 LAB — VITAMIN D 25 HYDROXY (VIT D DEFICIENCY, FRACTURES): Vit D, 25-Hydroxy: 35.4 ng/mL (ref 30.0–100.0)

## 2019-08-26 LAB — VITAMIN B12: Vitamin B-12: 575 pg/mL (ref 232–1245)

## 2019-08-26 LAB — T3, FREE: T3, Free: 2.3 pg/mL (ref 2.0–4.4)

## 2019-09-01 ENCOUNTER — Encounter: Payer: Self-pay | Admitting: Family Medicine

## 2019-09-09 ENCOUNTER — Ambulatory Visit: Payer: Medicare Other

## 2019-10-06 ENCOUNTER — Ambulatory Visit (INDEPENDENT_AMBULATORY_CARE_PROVIDER_SITE_OTHER): Payer: Medicare Other | Admitting: *Deleted

## 2019-10-06 ENCOUNTER — Telehealth: Payer: Self-pay | Admitting: *Deleted

## 2019-10-06 ENCOUNTER — Other Ambulatory Visit: Payer: Self-pay | Admitting: Family Medicine

## 2019-10-06 ENCOUNTER — Telehealth: Payer: Self-pay | Admitting: Family Medicine

## 2019-10-06 DIAGNOSIS — R430 Anosmia: Secondary | ICD-10-CM

## 2019-10-06 DIAGNOSIS — M4800 Spinal stenosis, site unspecified: Secondary | ICD-10-CM

## 2019-10-06 DIAGNOSIS — Z0000 Encounter for general adult medical examination without abnormal findings: Secondary | ICD-10-CM

## 2019-10-06 NOTE — Progress Notes (Signed)
MEDICARE ANNUAL WELLNESS VISIT  10/06/2019  Telephone Visit Disclaimer This Medicare AWV was conducted by telephone due to national recommendations for restrictions regarding the COVID-19 Pandemic (e.g. social distancing).  I verified, using two identifiers, that I am speaking with Miguel Hill or their authorized healthcare agent. I discussed the limitations, risks, security, and privacy concerns of performing an evaluation and management service by telephone and the potential availability of an in-person appointment in the future. The patient expressed understanding and agreed to proceed.   Subjective:  Miguel Hill is a 86 y.o. male patient of Stacks, Cletus Gash, MD who had a Medicare Annual Wellness Visit today via telephone. Miguel Hill is Retired and lives alone. he has 0 children. he reports that he is socially active and does interact with friends/family regularly. he is minimally physically active and enjoys gardening, and working on Company secretary .  Patient Care Team: Claretta Fraise, MD as PCP - General (Family Medicine)  Advanced Directives 10/06/2019 08/05/2018 07/25/2018 07/23/2018  Does Patient Have a Medical Advance Directive? Yes No No No  Type of Advance Directive Living will - - -  Does patient want to make changes to medical advance directive? No - Patient declined - - -  Would patient like information on creating a medical advance directive? - - No - Patient declined No - Patient declined    Hospital Utilization Over the Past 12 Months: # of hospitalizations or ER visits: 0 # of surgeries: 0  Review of Systems    Patient reports that his overall health is unchanged compared to last year.  History obtained from chart review and the patient Allergy and Immunology ROS: positive for - Patient complains with weakness and fatiue after eating certain foods.   Patient Reported Readings (BP, Pulse, CBG, Weight, etc) none  Pain Assessment Pain : No/denies  pain     Current Medications & Allergies (verified) Allergies as of 10/06/2019   No Known Allergies     Medication List       Accurate as of October 06, 2019 11:47 AM. If you have any questions, ask your nurse or doctor.        levothyroxine 25 MCG tablet Commonly known as: SYNTHROID Take 1 tablet (25 mcg total) by mouth daily.   Vitamin D (Ergocalciferol) 1.25 MG (50000 UNIT) Caps capsule Commonly known as: DRISDOL Take 1 capsule (50,000 Units total) by mouth every 7 (seven) days.       History (reviewed): Past Medical History:  Diagnosis Date  . Hypertension   . PONV (postoperative nausea and vomiting)   . Prediabetes   . Thyroid disease    Past Surgical History:  Procedure Laterality Date  . HERNIA REPAIR Bilateral   . TRANSURETHRAL RESECTION OF BLADDER TUMOR WITH MITOMYCIN-C N/A 07/25/2018   Procedure: CYSTOSCOPY TRANSURETHRAL RESECTION OF BLADDER TUMOR WITH GEMCITABINE INSTILLATION;  Surgeon: Irine Seal, MD;  Location: [facility];  Service: Urology;  Laterality: N/A;   Family History  Problem Relation Age of Onset  . Heart attack Father    Social History   Socioeconomic History  . Marital status: Divorced    Spouse name: Not on file  . Number of children: 0  . Years of education: 20  . Highest education level: Some college, no degree  Occupational History  . Occupation: retired  Tobacco Use  . Smoking status: Former Smoker    Quit date: 04/03/1985    Years since quitting: 34.5  . Smokeless tobacco: Current User  Types: Chew  . Tobacco comment: rarely uses chewing tobacco  Substance and Sexual Activity  . Alcohol use: No  . Drug use: No  . Sexual activity: Not Currently    Birth control/protection: None  Other Topics Concern  . Not on file  Social History Narrative  . Not on file   Social Determinants of Health   Financial Resource Strain:   . Difficulty of Paying Living Expenses: Not on file  Food Insecurity:   . Worried About Ship broker in the Last Year: Not on file  . Ran Out of Food in the Last Year: Not on file  Transportation Needs:   . Lack of Transportation (Medical): Not on file  . Lack of Transportation (Non-Medical): Not on file  Physical Activity:   . Days of Exercise per Week: Not on file  . Minutes of Exercise per Session: Not on file  Stress:   . Feeling of Stress : Not on file  Social Connections:   . Frequency of Communication with Friends and Family: Not on file  . Frequency of Social Gatherings with Friends and Family: Not on file  . Attends Religious Services: Not on file  . Active Member of Clubs or Organizations: Not on file  . Attends Archivist Meetings: Not on file  . Marital Status: Not on file    Activities of Daily Living In your present state of health, do you have any difficulty performing the following activities: 10/06/2019  Hearing? N  Vision? Y  Comment at times after he has reactions from food or medications  Difficulty concentrating or making decisions? N  Walking or climbing stairs? Y  Comment At times due to his legs being weak  Dressing or bathing? N  Doing errands, shopping? Y  Comment At times and tries to get someone to take hime  Preparing Food and eating ? N  Using the Toilet? N  In the past six months, have you accidently leaked urine? N  Do you have problems with loss of bowel control? N  Managing your Medications? N  Managing your Finances? N  Housekeeping or managing your Housekeeping? N  Some recent data might be hidden    Patient Education/ Literacy How often do you need to have someone help you when you read instructions, pamphlets, or other written materials from your doctor or pharmacy?: 1 - Never What is the last grade level you completed in school?: 2 years of college  Exercise Current Exercise Habits: The patient does not participate in regular exercise at present, Exercise limited by: orthopedic condition(s)  Diet Patient reports  consuming 3 meals a day and 0 snack(s) a day Patient reports that his primary diet is: Regular Patient reports that she does have regular access to food.   Depression Screen PHQ 2/9 Scores 10/06/2019 08/25/2019 02/11/2019 01/14/2019 11/14/2018 08/04/2018 12/30/2017  PHQ - 2 Score 0 0 0 0 0 0 0  PHQ- 9 Score 12 - - - - - -     Fall Risk Fall Risk  10/06/2019 08/25/2019 02/11/2019 01/14/2019 11/14/2018  Falls in the past year? 0 0 0 0 0     Objective:  Alhassane Sanson seemed alert and oriented and he participated appropriately during our telephone visit.  Blood Pressure Weight BMI  BP Readings from Last 3 Encounters:  08/25/19 (!) 162/88  02/11/19 (!) 187/85  01/14/19 (!) 172/93   Wt Readings from Last 3 Encounters:  08/25/19 140 lb 9.6 oz (63.8 kg)  02/11/19 141 lb (64 kg)  01/14/19 140 lb (63.5 kg)   BMI Readings from Last 1 Encounters:  08/25/19 19.61 kg/m    *Unable to obtain current vital signs, weight, and BMI due to telephone visit type  Hearing/Vision  . Bishara did not seem to have difficulty with hearing/understanding during the telephone conversation . Reports that he has had a formal eye exam by an eye care professional within the past year . Reports that he has not had a formal hearing evaluation within the past year *Unable to fully assess hearing and vision during telephone visit type  Cognitive Function: 6CIT Screen 10/06/2019  What Year? 0 points  What month? 0 points  What time? 0 points  Count back from 20 0 points  Months in reverse 0 points  Repeat phrase 0 points  Total Score 0   (Normal:0-7, Significant for Dysfunction: >8)  Normal Cognitive Function Screening: Yes   Immunization & Health Maintenance Record Immunization History  Administered Date(s) Administered  . Pneumococcal Polysaccharide-23 08/20/2008  . Tdap 08/20/2012  . Zoster 08/20/2009    Health Maintenance  Topic Date Due  . INFLUENZA VACCINE  11/18/2019 (Originally 03/21/2019)  . PNA vac  Low Risk Adult (2 of 2 - PCV13) 08/24/2020 (Originally 08/20/2009)  . TETANUS/TDAP  08/20/2022       Assessment  This is a routine wellness examination for [employer].  Health Maintenance: Due or Overdue There are no preventive care reminders to display for this patient.  Kolbi Shetty does not need a referral for Community Assistance: Care Management:   no Social Work:    no Prescription Assistance:  no Nutrition/Diabetes Education:  no   Plan:  Personalized Goals Goals Addressed            This Visit's Progress   . AWV       10/06/2019 AWV Goal: Fall Prevention  . Over the next year, patient will decrease their risk for falls by: o Using assistive devices, such as a cane or walker, as needed o Identifying fall risks within their home and correcting them by: - Removing throw rugs - Adding handrails to stairs or ramps - Removing clutter and keeping a clear pathway throughout the home - Increasing light, especially at night - Adding shower handles/bars - Raising toilet seat o Identifying potential personal risk factors for falls: - Medication side effects - Incontinence/urgency - Vestibular dysfunction - Hearing loss - Musculoskeletal disorders - Neurological disorders - Orthostatic hypotension        Personalized Health Maintenance & Screening Recommendations  Patient is up to date  Lung Cancer Screening Recommended: no (Low Dose CT Chest recommended if Age 56-80 years, 30 pack-year currently smoking OR have quit w/in past 15 years) Hepatitis C Screening recommended: no HIV Screening recommended: no  Advanced Directives: Written information was not prepared per patient's request.  Referrals & Orders Patient is requesting referral to allergist to discuss food and medication intolerances.  Follow-up Plan . Follow-up with Claretta Fraise, MD as planned . Please keep referral and appointment with neurosurgery . Referral discussed and note placed for Dr.  Livia Snellen to review.   I have personally reviewed and noted the following in the patient's chart:   . Medical and social history . Use of alcohol, tobacco or illicit drugs  . Current medications and supplements . Functional ability and status . Nutritional status . Physical activity . Advanced directives . List of other physicians . Hospitalizations, surgeries, and ER visits in previous 54  months . Vitals . Screenings to include cognitive, depression, and falls . Referrals and appointments  In addition, I have reviewed and discussed with Miguel Hill certain preventive protocols, quality metrics, and best practice recommendations. A written personalized care plan for preventive services as well as general preventive health recommendations is available and can be mailed to the patient at his request.      Lynnea Ferrier, LPN  [phone]

## 2019-10-06 NOTE — Telephone Encounter (Signed)
REFERRAL REQUEST Telephone Note  What type of referral do you need? Allergist and neurosurgery  Have you been seen at our office for this problem? Yes, would like to see allergist to discuss food and medication intolerance- fatigue and weakness. Neurosurgery- for pinched nerve back in July. Patient thought it was to the neurologist he seen in the past and did not like him. He is ok for neurosurgery referral.   (Advise that they will likely need an appointment with their PCP before a referral can be done)  Is there a particular doctor or location that you prefer? Patient does not have a preference   Patient notified that referrals can take up to a week or longer to process. If they haven't heard anything within a week they should call back and speak with the referral department.

## 2019-10-07 ENCOUNTER — Other Ambulatory Visit: Payer: Self-pay | Admitting: Family Medicine

## 2019-10-07 ENCOUNTER — Telehealth: Payer: Self-pay | Admitting: Family Medicine

## 2019-10-07 DIAGNOSIS — R5382 Chronic fatigue, unspecified: Secondary | ICD-10-CM

## 2019-10-07 DIAGNOSIS — R430 Anosmia: Secondary | ICD-10-CM

## 2019-10-07 NOTE — Telephone Encounter (Signed)
REFERRAL REQUEST Telephone Note  What type of referral do you need? Allergist  Have you been seen at our office for this problem? Yes - AWV - I spoke with Patient - An ENT Ref was placed for patient but Patient states he would like to see an allergist. (Advise that they will likely need an appointment with their PCP before a referral can be done)  Is there a particular doctor or location that you prefer? Patient is ok with going to [place].  Patient notified that referrals can take up to a week or longer to process. If they haven't heard anything within a week they should call back and speak with the referral department.

## 2019-10-07 NOTE — Telephone Encounter (Signed)
I changed it to allergist. Thanks for the help. WS

## 2019-11-03 ENCOUNTER — Telehealth: Payer: Self-pay | Admitting: Family Medicine

## 2019-11-03 NOTE — Telephone Encounter (Signed)
ORal B12 is giving him a god result from the lab. No need to switch to shots. WS

## 2019-11-03 NOTE — Telephone Encounter (Signed)
Aware. 

## 2019-11-03 NOTE — Telephone Encounter (Signed)
Patient's last B12 level was 575 on 10/12/2019.  Please advise.

## 2020-01-01 ENCOUNTER — Telehealth: Payer: Self-pay | Admitting: Family Medicine

## 2020-01-01 NOTE — Telephone Encounter (Signed)
Pre diabetic per chart.  Please advise on request for  test strips.

## 2020-01-01 NOTE — Telephone Encounter (Signed)
°  Prescription Request  01/01/2020  What is the name of the medication or equipment? accucheck test strips  Have you contacted your pharmacy to request a refill? (if applicable) no needs for meter  Which pharmacy would you like this sent to? [facility]   Patient notified that their request is being sent to the clinical staff for review and that they should receive a response within 2 business days.

## 2020-01-04 ENCOUNTER — Other Ambulatory Visit: Payer: Self-pay | Admitting: Family Medicine

## 2020-01-04 MED ORDER — ACCU-CHEK AVIVA PLUS VI STRP: ORAL_STRIP | 3 refills | Status: DC

## 2020-01-04 NOTE — Telephone Encounter (Signed)
Okay to order. Please fax scrip as requested.

## 2020-01-05 MED ORDER — ACCU-CHEK AVIVA PLUS VI STRP: ORAL_STRIP | 3 refills | Status: DC

## 2020-01-05 NOTE — Addendum Note (Signed)
Addended by: Karle Plumber on: 01/05/2020 08:25 AM   Modules accepted: Orders

## 2020-01-05 NOTE — Telephone Encounter (Signed)
Rx sent- patient aware.  

## 2020-05-10 ENCOUNTER — Encounter: Payer: Self-pay | Admitting: Family Medicine

## 2020-05-10 ENCOUNTER — Ambulatory Visit (INDEPENDENT_AMBULATORY_CARE_PROVIDER_SITE_OTHER): Payer: Medicare Other

## 2020-05-10 ENCOUNTER — Other Ambulatory Visit: Payer: Self-pay

## 2020-05-10 ENCOUNTER — Ambulatory Visit (INDEPENDENT_AMBULATORY_CARE_PROVIDER_SITE_OTHER): Payer: Medicare Other | Admitting: Family Medicine

## 2020-05-10 VITALS — BP 102/83 | HR 58 | Temp 97.9°F | Ht 71.0 in | Wt 137.4 lb

## 2020-05-10 DIAGNOSIS — R7303 Prediabetes: Secondary | ICD-10-CM | POA: Diagnosis not present

## 2020-05-10 DIAGNOSIS — Z125 Encounter for screening for malignant neoplasm of prostate: Secondary | ICD-10-CM

## 2020-05-10 DIAGNOSIS — E559 Vitamin D deficiency, unspecified: Secondary | ICD-10-CM

## 2020-05-10 DIAGNOSIS — M4800 Spinal stenosis, site unspecified: Secondary | ICD-10-CM

## 2020-05-10 DIAGNOSIS — I10 Essential (primary) hypertension: Secondary | ICD-10-CM | POA: Diagnosis not present

## 2020-05-10 DIAGNOSIS — E538 Deficiency of other specified B group vitamins: Secondary | ICD-10-CM

## 2020-05-10 DIAGNOSIS — R531 Weakness: Secondary | ICD-10-CM | POA: Diagnosis not present

## 2020-05-10 DIAGNOSIS — Z0001 Encounter for general adult medical examination with abnormal findings: Secondary | ICD-10-CM | POA: Diagnosis not present

## 2020-05-10 DIAGNOSIS — M47816 Spondylosis without myelopathy or radiculopathy, lumbar region: Secondary | ICD-10-CM

## 2020-05-10 DIAGNOSIS — J439 Emphysema, unspecified: Secondary | ICD-10-CM | POA: Diagnosis not present

## 2020-05-10 DIAGNOSIS — E782 Mixed hyperlipidemia: Secondary | ICD-10-CM

## 2020-05-10 DIAGNOSIS — E038 Other specified hypothyroidism: Secondary | ICD-10-CM

## 2020-05-10 DIAGNOSIS — Z0000 Encounter for general adult medical examination without abnormal findings: Secondary | ICD-10-CM

## 2020-05-10 LAB — URINALYSIS
Bilirubin, UA: NEGATIVE
Glucose, UA: NEGATIVE
Ketones, UA: NEGATIVE
Leukocytes,UA: NEGATIVE
Nitrite, UA: NEGATIVE
Protein,UA: NEGATIVE
Specific Gravity, UA: 1.02 (ref 1.005–1.030)
Urobilinogen, Ur: NEGATIVE mg/dL (ref 0.2–1.0)
pH, UA: 5.5 (ref 5.0–7.5)

## 2020-05-10 LAB — BAYER DCA HB A1C WAIVED: HB A1C (BAYER DCA - WAIVED): 5.8 % (ref <7.0)

## 2020-05-10 NOTE — Progress Notes (Signed)
Subjective:  Patient ID: Miguel Hill, male    DOB: 1933-01-01  Age: 86 y.o. MRN: [id number]  CC: Annual Exam   HPI Miguel Hill presents for annual physical examination.  Patient states that his legs are weak.  The weakness seems to increase.  He also complains of poor energy.  He has been taking sublingual B12.  However that makes him sleepy.  He has not had any relief from the fatigue with the B12.  He has not had any edema or shortness of breath.  He denies melena. Miguel Hill reports that he did see the neurosurgeon last fall for the abnormality noted on his MRI.  He was told essentially there was nothing that could be done for him at this time by the consulting neurosurgeon.  Depression screen [facility] 2/9 05/10/2020 10/06/2019 08/25/2019  Decreased Interest 0 0 0  Down, Depressed, Hopeless 0 0 0  PHQ - 2 Score 0 0 0  Altered sleeping - 3 -  Tired, decreased energy - 3 -  Change in appetite - 3 -  Feeling bad or failure about yourself  - 2 -  Trouble concentrating - 1 -  Moving slowly or fidgety/restless - 0 -  Suicidal thoughts - 0 -  PHQ-9 Score - 12 -  Difficult doing work/chores - Somewhat difficult -    History Miguel Hill has a past medical history of Hypertension, PONV (postoperative nausea and vomiting), Prediabetes, and Thyroid disease.   He has a past surgical history that includes Hernia repair (Bilateral) and Transurethral resection of bladder tumor with mitomycin-c (N/A, 07/25/2018).   His family history includes Heart attack in his father.He reports that he quit smoking about 35 years ago. His smokeless tobacco use includes chew. He reports that he does not drink alcohol and does not use drugs.    ROS Review of Systems  Constitutional: Positive for fatigue. Negative for activity change and unexpected weight change.  HENT: Negative for congestion, ear pain, hearing loss, postnasal drip and trouble swallowing.   Eyes: Negative for pain and visual disturbance.    Respiratory: Negative for cough, chest tightness and shortness of breath.   Cardiovascular: Negative for chest pain, palpitations and leg swelling.  Gastrointestinal: Negative for abdominal distention, abdominal pain, blood in stool, constipation, diarrhea, nausea and vomiting.  Endocrine: Negative for cold intolerance, heat intolerance and polydipsia.  Genitourinary: Negative for difficulty urinating, dysuria, flank pain, frequency and urgency.  Musculoskeletal: Positive for arthralgias. Negative for back pain and joint swelling.  Skin: Negative for color change, rash and wound.  Neurological: Positive for weakness (bilaterally symmetric in the legs). Negative for dizziness, syncope, speech difficulty, light-headedness, numbness and headaches.  Hematological: Does not bruise/bleed easily.  Psychiatric/Behavioral: Negative for confusion, decreased concentration, dysphoric mood and sleep disturbance. The patient is not nervous/anxious.     Objective:  BP 102/83   Pulse (!) 58   Temp 97.9 F (36.6 C) (Temporal)   Ht $R'5\' 11"'XI$  (1.803 m)   Wt 137 lb 6.4 oz (62.3 kg)   BMI 19.16 kg/m   BP Readings from Last 3 Encounters:  05/10/20 102/83  08/25/19 (!) 162/88  02/11/19 (!) 187/85    Wt Readings from Last 3 Encounters:  05/10/20 137 lb 6.4 oz (62.3 kg)  08/25/19 140 lb 9.6 oz (63.8 kg)  02/11/19 141 lb (64 kg)     Physical Exam Constitutional:      General: He is not in acute distress.    Appearance: He is well-developed.  HENT:  Head: Normocephalic and atraumatic.     Right Ear: External ear normal.     Left Ear: External ear normal.     Nose: Nose normal.  Eyes:     Conjunctiva/sclera: Conjunctivae normal.     Pupils: Pupils are equal, round, and reactive to light.  Cardiovascular:     Rate and Rhythm: Normal rate and regular rhythm.     Heart sounds: Normal heart sounds. No murmur heard.   Pulmonary:     Effort: Pulmonary effort is normal. No respiratory distress.      Breath sounds: Normal breath sounds. No wheezing or rales.  Abdominal:     Palpations: Abdomen is soft.     Tenderness: There is no abdominal tenderness.  Musculoskeletal:        General: Normal range of motion.     Cervical back: Normal range of motion and neck supple.  Skin:    General: Skin is warm and dry.  Neurological:     Mental Status: He is alert and oriented to person, place, and time.     Deep Tendon Reflexes: Reflexes are normal and symmetric.  Psychiatric:        Behavior: Behavior normal.        Thought Content: Thought content normal.        Judgment: Judgment normal.       Assessment & Plan:   Miguel Hill was seen today for annual exam.  Diagnoses and all orders for this visit:  Annual physical exam -     Urinalysis -     CBC with Differential/Platelet -     CMP14+EGFR  Prediabetes -     CBC with Differential/Platelet -     CMP14+EGFR -     Bayer DCA Hb A1c Waived  Spinal stenosis, unspecified spinal region -     CBC with Differential/Platelet -     CMP14+EGFR  Essential hypertension -     CBC with Differential/Platelet -     CMP14+EGFR -     Magnesium -     Phosphorus  Other specified hypothyroidism -     CBC with Differential/Platelet -     CMP14+EGFR -     TSH + free T4 -     Magnesium -     Phosphorus  Screening for prostate cancer -     PSA Total (Reflex To Free)  Vitamin D deficiency -     CBC with Differential/Platelet -     CMP14+EGFR -     VITAMIN D 25 Hydroxy (Vit-D Deficiency, Fractures)  Mixed hyperlipidemia -     Lipid panel  B12 deficiency -     Methylmalonic Acid, Serum -     Vitamin B12  Weakness -     Magnesium -     Phosphorus -     DG Chest 2 View; Future  Lumbar spondylosis  Pulmonary emphysema, unspecified emphysema type (HCC) -     DG Chest 2 View; Future       I have discontinued Miguel Hill's levothyroxine and Accu-Chek Aviva Plus.  Allergies as of 05/10/2020   No Known Allergies      Medication List       Accurate as of May 10, 2020 10:06 PM. If you have any questions, ask your nurse or doctor.        STOP taking these medications   Accu-Chek Aviva Plus test strip Generic drug: glucose blood Stopped by: Mechele Claude, MD   levothyroxine 25 MCG tablet Commonly  known as: SYNTHROID Stopped by: Claretta Fraise, MD        Follow-up: Return in about 6 months (around 11/07/2020).  Claretta Fraise, M.D.

## 2020-05-13 ENCOUNTER — Other Ambulatory Visit: Payer: Self-pay | Admitting: Family Medicine

## 2020-05-13 MED ORDER — LEVOTHYROXINE SODIUM 50 MCG PO TABS: 50.0000 ug | ORAL_TABLET | Freq: Every day | ORAL | 0 refills | Status: DC

## 2020-05-16 LAB — CBC WITH DIFFERENTIAL/PLATELET
Basophils Absolute: 0 10*3/uL (ref 0.0–0.2)
Basos: 0 %
EOS (ABSOLUTE): 0.1 10*3/uL (ref 0.0–0.4)
Eos: 1 %
Hematocrit: 42.2 % (ref 37.5–51.0)
Hemoglobin: 14.5 g/dL (ref 13.0–17.7)
Immature Grans (Abs): 0 10*3/uL (ref 0.0–0.1)
Immature Granulocytes: 0 %
Lymphocytes Absolute: 1.2 10*3/uL (ref 0.7–3.1)
Lymphs: 23 %
MCH: 29.6 pg (ref 26.6–33.0)
MCHC: 34.4 g/dL (ref 31.5–35.7)
MCV: 86 fL (ref 79–97)
Monocytes Absolute: 0.5 10*3/uL (ref 0.1–0.9)
Monocytes: 9 %
Neutrophils Absolute: 3.4 10*3/uL (ref 1.4–7.0)
Neutrophils: 67 %
Platelets: 136 10*3/uL — ABNORMAL LOW (ref 150–450)
RBC: 4.9 x10E6/uL (ref 4.14–5.80)
RDW: 12.8 % (ref 11.6–15.4)
WBC: 5.1 10*3/uL (ref 3.4–10.8)

## 2020-05-16 LAB — CMP14+EGFR
ALT: 8 IU/L (ref 0–44)
AST: 13 IU/L (ref 0–40)
Albumin/Globulin Ratio: 1.4 (ref 1.2–2.2)
Albumin: 3.9 g/dL (ref 3.6–4.6)
Alkaline Phosphatase: 85 IU/L (ref 44–121)
BUN/Creatinine Ratio: 18 (ref 10–24)
BUN: 18 mg/dL (ref 8–27)
Bilirubin Total: 0.7 mg/dL (ref 0.0–1.2)
CO2: 24 mmol/L (ref 20–29)
Calcium: 9.2 mg/dL (ref 8.6–10.2)
Chloride: 101 mmol/L (ref 96–106)
Creatinine, Ser: 1.01 mg/dL (ref 0.76–1.27)
GFR calc Af Amer: 79 mL/min/{1.73_m2} (ref >59)
GFR calc non Af Amer: 68 mL/min/{1.73_m2} (ref >59)
Globulin, Total: 2.8 g/dL (ref 1.5–4.5)
Glucose: 99 mg/dL (ref 65–99)
Potassium: 3.9 mmol/L (ref 3.5–5.2)
Sodium: 140 mmol/L (ref 134–144)
Total Protein: 6.7 g/dL (ref 6.0–8.5)

## 2020-05-16 LAB — TSH+FREE T4
Free T4: 1.1 ng/dL (ref 0.82–1.77)
TSH: 5.29 u[IU]/mL — ABNORMAL HIGH (ref 0.450–4.500)

## 2020-05-16 LAB — FPSA% REFLEX
% FREE PSA: 16.3 %
PSA, FREE: 0.75 ng/mL

## 2020-05-16 LAB — LIPID PANEL
Chol/HDL Ratio: 3.1 ratio (ref 0.0–5.0)
Cholesterol, Total: 212 mg/dL — ABNORMAL HIGH (ref 100–199)
HDL: 68 mg/dL (ref >39)
LDL Chol Calc (NIH): 131 mg/dL — ABNORMAL HIGH (ref 0–99)
Triglycerides: 74 mg/dL (ref 0–149)
VLDL Cholesterol Cal: 13 mg/dL (ref 5–40)

## 2020-05-16 LAB — VITAMIN D 25 HYDROXY (VIT D DEFICIENCY, FRACTURES): Vit D, 25-Hydroxy: 63.5 ng/mL (ref 30.0–100.0)

## 2020-05-16 LAB — VITAMIN B12: Vitamin B-12: 317 pg/mL (ref 232–1245)

## 2020-05-16 LAB — METHYLMALONIC ACID, SERUM: Methylmalonic Acid: 250 nmol/L (ref 0–378)

## 2020-05-16 LAB — PHOSPHORUS: Phosphorus: 3.5 mg/dL (ref 2.8–4.1)

## 2020-05-16 LAB — PSA TOTAL (REFLEX TO FREE): Prostate Specific Ag, Serum: 4.6 ng/mL — ABNORMAL HIGH (ref 0.0–4.0)

## 2020-05-16 LAB — MAGNESIUM: Magnesium: 1.9 mg/dL (ref 1.6–2.3)

## 2020-05-17 ENCOUNTER — Encounter: Payer: Self-pay | Admitting: *Deleted

## 2020-05-17 ENCOUNTER — Telehealth: Payer: Self-pay | Admitting: Family Medicine

## 2020-05-17 ENCOUNTER — Other Ambulatory Visit: Payer: Self-pay

## 2020-05-17 MED ORDER — ACCU-CHEK AVIVA PLUS VI STRP: ORAL_STRIP | 3 refills | Status: DC

## 2020-05-20 ENCOUNTER — Telehealth: Payer: Self-pay

## 2020-05-22 ENCOUNTER — Other Ambulatory Visit: Payer: Self-pay | Admitting: Family Medicine

## 2020-05-22 DIAGNOSIS — Z1211 Encounter for screening for malignant neoplasm of colon: Secondary | ICD-10-CM

## 2020-05-22 NOTE — Telephone Encounter (Signed)
I ordered the cologuard.

## 2020-05-22 NOTE — Telephone Encounter (Signed)
Not unless the dose is too high.

## 2020-05-23 ENCOUNTER — Other Ambulatory Visit: Payer: Self-pay | Admitting: *Deleted

## 2020-05-23 NOTE — Telephone Encounter (Signed)
Aware of cologuard ordered and instructions.

## 2020-05-23 NOTE — Telephone Encounter (Signed)
Patient aware.

## 2020-06-14 ENCOUNTER — Ambulatory Visit: Payer: Medicare Other | Admitting: Family Medicine

## 2020-06-20 ENCOUNTER — Ambulatory Visit: Payer: Medicare Other | Admitting: Family Medicine

## 2020-07-29 ENCOUNTER — Encounter: Payer: Self-pay | Admitting: Family Medicine

## 2020-07-29 ENCOUNTER — Other Ambulatory Visit: Payer: Self-pay

## 2020-07-29 ENCOUNTER — Ambulatory Visit: Payer: Medicare Other | Admitting: Family Medicine

## 2020-07-29 VITALS — BP 187/99 | HR 69 | Temp 97.6°F | Resp 20 | Ht 71.0 in | Wt 138.4 lb

## 2020-07-29 DIAGNOSIS — R5383 Other fatigue: Secondary | ICD-10-CM

## 2020-07-29 DIAGNOSIS — G20 Parkinson's disease: Secondary | ICD-10-CM

## 2020-07-29 DIAGNOSIS — G20C Parkinsonism, unspecified: Secondary | ICD-10-CM

## 2020-07-29 DIAGNOSIS — E038 Other specified hypothyroidism: Secondary | ICD-10-CM | POA: Diagnosis not present

## 2020-07-29 MED ORDER — THYROID 195 MG PO TABS: 195.0000 mg | ORAL_TABLET | Freq: Every day | ORAL | 2 refills | Status: DC

## 2020-07-29 MED ORDER — CARBIDOPA-LEVODOPA 10-100 MG PO TABS: 1.0000 | ORAL_TABLET | Freq: Three times a day (TID) | ORAL | 5 refills | Status: AC

## 2020-07-29 NOTE — Progress Notes (Signed)
Subjective:  Patient ID: Miguel Hill, male    DOB: 10/22/1932  Age: 86 y.o. MRN: [id number]  CC: Fatigue and Constipation   HPI Miguel Hill presents for continued aching in the legs. He says they are getting weaker. He feels exhausted. He states that the thyroid med made hm weaker. Wants to try naturethroid.   Depression screen [facility] 2/9 07/29/2020 05/10/2020 10/06/2019  Decreased Interest 0 0 0  Down, Depressed, Hopeless 0 0 0  PHQ - 2 Score 0 0 0  Altered sleeping - - 3  Tired, decreased energy - - 3  Change in appetite - - 3  Feeling bad or failure about yourself  - - 2  Trouble concentrating - - 1  Moving slowly or fidgety/restless - - 0  Suicidal thoughts - - 0  PHQ-9 Score - - 12  Difficult doing work/chores - - Somewhat difficult    History Miguel Hill has a past medical history of Hypertension, PONV (postoperative nausea and vomiting), Prediabetes, and Thyroid disease.   He has a past surgical history that includes Hernia repair (Bilateral) and Transurethral resection of bladder tumor with mitomycin-c (N/A, 07/25/2018).   His family history includes Heart attack in his father.He reports that he quit smoking about 35 years ago. His smokeless tobacco use includes chew. He reports that he does not drink alcohol and does not use drugs.    ROS Review of Systems  Constitutional: Negative for fever.  Respiratory: Negative for shortness of breath.   Cardiovascular: Negative for chest pain.  Musculoskeletal: Negative for arthralgias.  Skin: Negative for rash.  Neurological: Positive for weakness.    Objective:  BP (!) 187/99   Pulse 69   Temp 97.6 F (36.4 C) (Temporal)   Resp 20   Ht 5' 11" (1.803 m)   Wt 138 lb 6 oz (62.8 kg)   SpO2 98%   BMI 19.30 kg/m   BP Readings from Last 3 Encounters:  07/29/20 (!) 187/99  05/10/20 102/83  08/25/19 (!) 162/88    Wt Readings from Last 3 Encounters:  07/29/20 138 lb 6 oz (62.8 kg)  05/10/20 137 lb 6.4 oz (62.3 kg)   08/25/19 140 lb 9.6 oz (63.8 kg)     Physical Exam Vitals reviewed.  Constitutional:      Appearance: He is well-developed and well-nourished.  HENT:     Head: Normocephalic and atraumatic.     Right Ear: Tympanic membrane and external ear normal. No decreased hearing noted.     Left Ear: Tympanic membrane and external ear normal. No decreased hearing noted.     Mouth/Throat:     Pharynx: No oropharyngeal exudate or posterior oropharyngeal erythema.  Eyes:     Pupils: Pupils are equal, round, and reactive to light.  Cardiovascular:     Rate and Rhythm: Normal rate and regular rhythm.     Heart sounds: No murmur heard.   Pulmonary:     Effort: No respiratory distress.     Breath sounds: Normal breath sounds.  Abdominal:     General: Bowel sounds are normal.     Palpations: Abdomen is soft. There is no mass.     Tenderness: There is no abdominal tenderness.  Musculoskeletal:     Cervical back: Normal range of motion and neck supple.  Neurological:     Coordination: Coordination abnormal (shuffling gait).       Assessment & Plan:   [name] was seen today for fatigue and constipation.  Diagnoses and all orders  for this visit:  Other specified hypothyroidism -     CBC with Differential/Platelet -     CMP14+EGFR -     C-reactive protein -     Testosterone -     TSH -     T3, Free  Fatigue, unspecified type -     CBC with Differential/Platelet -     CMP14+EGFR -     C-reactive protein -     Testosterone -     TSH  Parkinsonism, unspecified Parkinsonism type ([place])  Other orders -     thyroid (NATURE-THROID) 195 MG tablet; Take 1 tablet (195 mg total) by mouth daily. -     carbidopa-levodopa (SINEMET) 10-100 MG tablet; Take 1 tablet by mouth 3 (three) times daily. For parkinsonism       I am having [name] start on thyroid and carbidopa-levodopa. I am also having him maintain his levothyroxine and Accu-Chek Aviva Plus.  Allergies as of 07/29/2020    No Known Allergies     Medication List       Accurate as of July 29, 2020 11:59 PM. If you have any questions, ask your nurse or doctor.        Accu-Chek Aviva Plus test strip Generic drug: glucose blood USE AS DIRECTED FOR TESTING SUGAR TWICE DAILY E11.9   carbidopa-levodopa 10-100 MG tablet Commonly known as: Sinemet Take 1 tablet by mouth 3 (three) times daily. For parkinsonism Started by: Claretta Fraise, MD   levothyroxine 50 MCG tablet Commonly known as: SYNTHROID Take 1 tablet (50 mcg total) by mouth daily.   thyroid 195 MG tablet Commonly known as: Nature-Throid Take 1 tablet (195 mg total) by mouth daily. Started by: Claretta Fraise, MD        Follow-up: Return in about 2 months (around 09/29/2020).  Claretta Fraise, M.D.

## 2020-07-30 LAB — CMP14+EGFR
ALT: 13 IU/L (ref 0–44)
AST: 16 IU/L (ref 0–40)
Albumin/Globulin Ratio: 1.4 (ref 1.2–2.2)
Albumin: 4.1 g/dL (ref 3.6–4.6)
Alkaline Phosphatase: 73 IU/L (ref 44–121)
BUN/Creatinine Ratio: 20 (ref 10–24)
BUN: 20 mg/dL (ref 8–27)
Bilirubin Total: 0.6 mg/dL (ref 0.0–1.2)
CO2: 25 mmol/L (ref 20–29)
Calcium: 9.1 mg/dL (ref 8.6–10.2)
Chloride: 102 mmol/L (ref 96–106)
Creatinine, Ser: 1 mg/dL (ref 0.76–1.27)
GFR calc Af Amer: 79 mL/min/{1.73_m2} (ref >59)
GFR calc non Af Amer: 68 mL/min/{1.73_m2} (ref >59)
Globulin, Total: 2.9 g/dL (ref 1.5–4.5)
Glucose: 106 mg/dL — ABNORMAL HIGH (ref 65–99)
Potassium: 3.7 mmol/L (ref 3.5–5.2)
Sodium: 142 mmol/L (ref 134–144)
Total Protein: 7 g/dL (ref 6.0–8.5)

## 2020-07-30 LAB — CBC WITH DIFFERENTIAL/PLATELET
Basophils Absolute: 0 10*3/uL (ref 0.0–0.2)
Basos: 0 %
EOS (ABSOLUTE): 0.1 10*3/uL (ref 0.0–0.4)
Eos: 1 %
Hematocrit: 44.5 % (ref 37.5–51.0)
Hemoglobin: 15.1 g/dL (ref 13.0–17.7)
Immature Grans (Abs): 0 10*3/uL (ref 0.0–0.1)
Immature Granulocytes: 0 %
Lymphocytes Absolute: 1.4 10*3/uL (ref 0.7–3.1)
Lymphs: 30 %
MCH: 30 pg (ref 26.6–33.0)
MCHC: 33.9 g/dL (ref 31.5–35.7)
MCV: 88 fL (ref 79–97)
Monocytes Absolute: 0.4 10*3/uL (ref 0.1–0.9)
Monocytes: 9 %
Neutrophils Absolute: 2.6 10*3/uL (ref 1.4–7.0)
Neutrophils: 60 %
Platelets: 147 10*3/uL — ABNORMAL LOW (ref 150–450)
RBC: 5.04 x10E6/uL (ref 4.14–5.80)
RDW: 13.1 % (ref 11.6–15.4)
WBC: 4.5 10*3/uL (ref 3.4–10.8)

## 2020-07-30 LAB — C-REACTIVE PROTEIN: CRP: 1 mg/L (ref 0–10)

## 2020-07-30 LAB — TSH: TSH: 7.33 u[IU]/mL — ABNORMAL HIGH (ref 0.450–4.500)

## 2020-07-30 LAB — TESTOSTERONE: Testosterone: 594 ng/dL (ref 264–916)

## 2020-07-30 LAB — T3, FREE: T3, Free: 2.6 pg/mL (ref 2.0–4.4)

## 2020-10-10 LAB — COLOGUARD: Cologuard: NEGATIVE

## 2020-10-11 ENCOUNTER — Other Ambulatory Visit: Payer: Self-pay | Admitting: Family Medicine

## 2020-10-11 ENCOUNTER — Ambulatory Visit (INDEPENDENT_AMBULATORY_CARE_PROVIDER_SITE_OTHER): Payer: Medicare Other

## 2020-10-11 ENCOUNTER — Telehealth: Payer: Self-pay

## 2020-10-11 DIAGNOSIS — Z0000 Encounter for general adult medical examination without abnormal findings: Secondary | ICD-10-CM | POA: Diagnosis not present

## 2020-10-11 MED ORDER — THYROID 30 MG PO TABS: 30.0000 mg | ORAL_TABLET | Freq: Every day | ORAL | 1 refills | Status: AC

## 2020-10-11 MED ORDER — THYROID 60 MG PO TABS: 60.0000 mg | ORAL_TABLET | Freq: Every day | ORAL | 2 refills | Status: DC

## 2020-10-11 MED ORDER — THYROID 30 MG PO TABS: 30.0000 mg | ORAL_TABLET | Freq: Every day | ORAL | 11 refills | Status: DC

## 2020-10-11 NOTE — Telephone Encounter (Signed)
Pt will try this one - he is not sure that he can tolerate it - aware it is at the pharmacy    He prefers to take 30 mg of armour Thyroid if possible - can we change to dose ?

## 2020-10-11 NOTE — Telephone Encounter (Signed)
I sent in the prescription for 60 mg.  At the end of 6 weeks I should see him for this to see if the dose is appropriate for him.  He should drop by couple of days ahead of time for a TSH and a free T4 reading.

## 2020-10-11 NOTE — Telephone Encounter (Signed)
Please let the patient know that I sent their prescription to their pharmacy. Thanks, WS 

## 2020-10-11 NOTE — Telephone Encounter (Signed)
Miguel Hill was prescribed for the patient in December and it is still on backorder from the manufacturer.  Patient is interested in trying Armour Thyroid and would like to know if you will prescribe this instead.  Patient's pharmacy is [facility].

## 2020-10-11 NOTE — Telephone Encounter (Signed)
Patient notifed

## 2020-10-11 NOTE — Progress Notes (Signed)
MEDICARE ANNUAL WELLNESS VISIT  10/11/2020  Telephone Visit Disclaimer This Medicare AWV was conducted by telephone due to national recommendations for restrictions regarding the COVID-19 Pandemic (e.g. social distancing).  I verified, using two identifiers, that I am speaking with Miguel Hill or their authorized healthcare agent. I discussed the limitations, risks, security, and privacy concerns of performing an evaluation and management service by telephone and the potential availability of an in-person appointment in the future. The patient expressed understanding and agreed to proceed.  Location of Patient: Home Location of Provider (nurse):  [facility]  Subjective:    Miguel Hill is a 87 y.o. male patient of Stacks, Cletus Gash, MD who had a Medicare Annual Wellness Visit today via telephone. Miguel Hill is Retired and lives alone. He has no children. He reports that he is socially active and does interact with friends/family regularly. He is minimally physically active and enjoys watching television.  Patient Care Team: Claretta Fraise, MD as PCP - General (Family Medicine)  Advanced Directives 10/11/2020 10/06/2019 08/05/2018 07/25/2018 07/23/2018  Does Patient Have a Medical Advance Directive? No Yes No No No  Type of Advance Directive - Living will - - -  Does patient want to make changes to medical advance directive? - No - Patient declined - - -  Would patient like information on creating a medical advance directive? No - Patient declined - - No - Patient declined No - Patient declined    Hospital Utilization Over the Past 12 Months: # of hospitalizations or ER visits: 0 # of surgeries: 0  Review of Systems    Patient reports that his overall health is unchanged compared to last year.  History obtained from chart review and the patient  Patient Reported Readings (BP, Pulse, CBG, Weight, etc) none  Pain Assessment Pain : No/denies pain     Current Medications & Allergies  (verified) Allergies as of 10/11/2020   No Known Allergies     Medication List       Accurate as of October 11, 2020 10:24 AM. If you have any questions, ask your nurse or doctor.        Accu-Chek Aviva Plus test strip Generic drug: glucose blood USE AS DIRECTED FOR TESTING SUGAR TWICE DAILY E11.9   carbidopa-levodopa 10-100 MG tablet Commonly known as: Sinemet Take 1 tablet by mouth 3 (three) times daily. For parkinsonism   levothyroxine 50 MCG tablet Commonly known as: SYNTHROID Take 1 tablet (50 mcg total) by mouth daily.   thyroid 195 MG tablet Commonly known as: Nature-Throid Take 1 tablet (195 mg total) by mouth daily.       History (reviewed): Past Medical History:  Diagnosis Date  . Hypertension   . PONV (postoperative nausea and vomiting)   . Prediabetes   . Thyroid disease    Past Surgical History:  Procedure Laterality Date  . HERNIA REPAIR Bilateral   . TRANSURETHRAL RESECTION OF BLADDER TUMOR WITH MITOMYCIN-C N/A 07/25/2018   Procedure: CYSTOSCOPY TRANSURETHRAL RESECTION OF BLADDER TUMOR WITH GEMCITABINE INSTILLATION;  Surgeon: Irine Seal, MD;  Location: [facility];  Service: Urology;  Laterality: N/A;   Family History  Problem Relation Age of Onset  . Heart attack Father    Social History   Socioeconomic History  . Marital status: Divorced    Spouse name: Not on file  . Number of children: 0  . Years of education: 51  . Highest education level: Some college, no degree  Occupational History  . Occupation: retired  Tobacco Use  . Smoking status: Former Smoker    Quit date: 04/03/1985    Years since quitting: 35.5  . Smokeless tobacco: Current User    Types: Chew  . Tobacco comment: rarely uses chewing tobacco  Vaping Use  . Vaping Use: Never used  Substance and Sexual Activity  . Alcohol use: No  . Drug use: No  . Sexual activity: Not Currently    Birth control/protection: None  Other Topics Concern  . Not on file  Social History  Narrative  . Not on file   Social Determinants of Health   Financial Resource Strain: Not on file  Food Insecurity: Not on file  Transportation Needs: Not on file  Physical Activity: Not on file  Stress: Not on file  Social Connections: Not on file    Activities of Daily Living In your present state of health, do you have any difficulty performing the following activities: 10/11/2020  Hearing? Y  Comment blurry vision  Vision? N  Difficulty concentrating or making decisions? N  Walking or climbing stairs? N  Dressing or bathing? N  Doing errands, shopping? N  Preparing Food and eating ? N  Using the Toilet? N  In the past six months, have you accidently leaked urine? N  Do you have problems with loss of bowel control? N  Managing your Medications? N  Managing your Finances? N  Housekeeping or managing your Housekeeping? N  Some recent data might be hidden   Patient reports he does experience some blurry vision and has an eye appointment scheduled in April.  Patient Education/ Literacy How often do you need to have someone help you when you read instructions, pamphlets, or other written materials from your doctor or pharmacy?: 1 - Never What is the last grade level you completed in school?: Associates degree  Exercise Current Exercise Habits: The patient does not participate in regular exercise at present, Exercise limited by: None identified  Diet Patient reports consuming 3 meals a day and 0 snack(s) a day Patient reports that his primary diet is: Regular Patient reports that he does have regular access to food.   Depression Screen PHQ 2/9 Scores 10/11/2020 07/29/2020 05/10/2020 10/06/2019 08/25/2019 02/11/2019 01/14/2019  PHQ - 2 Score 0 0 0 0 0 0 0  PHQ- 9 Score - - - 12 - - -     Fall Risk Fall Risk  10/11/2020 07/29/2020 05/10/2020 10/06/2019 08/25/2019  Falls in the past year? 0 0 0 0 0  Number falls in past yr: - - 0 - -  Injury with Fall? - - 0 - -  Risk for fall  due to : - - Impaired balance/gait - -  Follow up Falls evaluation completed Falls evaluation completed Falls evaluation completed - -     Objective:  Miguel Hill seemed alert and oriented and he participated appropriately during our telephone visit.  Blood Pressure Weight BMI  BP Readings from Last 3 Encounters:  07/29/20 (!) 187/99  05/10/20 102/83  08/25/19 (!) 162/88   Wt Readings from Last 3 Encounters:  07/29/20 138 lb 6 oz (62.8 kg)  05/10/20 137 lb 6.4 oz (62.3 kg)  08/25/19 140 lb 9.6 oz (63.8 kg)   BMI Readings from Last 1 Encounters:  07/29/20 19.30 kg/m    *Unable to obtain current vital signs, weight, and BMI due to telephone visit type  Hearing/Vision  . Miguel Hill did not seem to have difficulty with hearing/understanding during the telephone conversation . Reports that  he has had a formal eye exam by an eye care professional within the past year . Reports that he has not had a formal hearing evaluation within the past year *Unable to fully assess hearing and vision during telephone visit type  Cognitive Function: 6CIT Screen 10/11/2020 10/06/2019  What Year? 0 points 0 points  What month? 0 points 0 points  What time? 0 points 0 points  Count back from 20 0 points 0 points  Months in reverse 0 points 0 points  Repeat phrase 0 points 0 points  Total Score 0 0   (Normal:0-7, Significant for Dysfunction: >8)  Normal Cognitive Function Screening: Yes   Immunization & Health Maintenance Record Immunization History  Administered Date(s) Administered  . Pneumococcal Polysaccharide-23 08/20/2008  . Tdap 08/20/2012  . Zoster 08/20/2009    Health Maintenance  Topic Date Due  . COVID-19 Vaccine (1) Never done  . PNA vac Low Risk Adult (2 of 2 - PCV13) 08/20/2009  . INFLUENZA VACCINE  11/17/2020 (Originally 03/20/2020)  . TETANUS/TDAP  08/20/2022       Assessment  This is a routine wellness examination for Miguel Hill.  Health Maintenance: Due or  Overdue Health Maintenance Due  Topic Date Due  . COVID-19 Vaccine (1) Never done  . PNA vac Low Risk Adult (2 of 2 - PCV13) 08/20/2009    Miguel Hill does not need a referral for Community Assistance: Care Management:   no Social Work:    no Prescription Assistance:  no Nutrition/Diabetes Education:  no   Plan:  Personalized Goals Goals Addressed            This Visit's Progress   . Patient Stated       10/11/2020 AWV Goal: Fall Prevention  . Over the next year, patient will decrease their risk for falls by: o Using assistive devices, such as a cane or walker, as needed o Identifying fall risks within their home and correcting them by: - Removing throw rugs - Adding handrails to stairs or ramps - Removing clutter and keeping a clear pathway throughout the home - Increasing light, especially at night - Adding shower handles/bars - Raising toilet seat o Identifying potential personal risk factors for falls: - Medication side effects - Incontinence/urgency - Vestibular dysfunction - Hearing loss - Musculoskeletal disorders - Neurological disorders - Orthostatic hypotension        Personalized Health Maintenance & Screening Recommendations  Pneumococcal vaccine   Lung Cancer Screening Recommended: no (Low Dose CT Chest recommended if Age 51-80 years, 30 pack-year currently smoking OR have quit w/in past 15 years) Hepatitis C Screening recommended: no HIV Screening recommended: no  Advanced Directives: Written information was not prepared per patient's request.  Referrals & Orders No orders of the defined types were placed in this encounter.   Follow-up Plan . Follow-up with Claretta Fraise, MD as planned     I have personally reviewed and noted the following in the patient's chart:   . Medical and social history . Use of alcohol, tobacco or illicit drugs  . Current medications and supplements . Functional ability and status . Nutritional  status . Physical activity . Advanced directives . List of other physicians . Hospitalizations, surgeries, and ER visits in previous 12 months . Vitals . Screenings to include cognitive, depression, and falls . Referrals and appointments  In addition, I have reviewed and discussed with Miguel Hill certain preventive protocols, quality metrics, and best practice recommendations. A written personalized care plan for  preventive services as well as general preventive health recommendations is available and can be mailed to the patient at his request.      Felicity Coyer, LPN     [id number]  Patient declined after visit summary.

## 2020-10-26 ENCOUNTER — Telehealth: Payer: Self-pay

## 2020-10-26 NOTE — Telephone Encounter (Signed)
Miguel Hill, can you please check the Cologuard website to see if this patient's results are back.

## 2020-10-26 NOTE — Telephone Encounter (Signed)
Patient aware cologuard result normal

## 2020-10-28 LAB — COLOGUARD: Cologuard: NEGATIVE

## 2020-11-12 IMAGING — US US RENAL
1 series · 14 of 25 positions shown · non-contrast
Comparison: CT scan of May 22, 2018.

CLINICAL DATA: Acquired hydronephrosis with ureteropelvic junction
obstruction.

EXAM:
RENAL / URINARY TRACT ULTRASOUND COMPLETE

[Series 1: us renal · 14 of 54 slices shown]
[im 1/54]
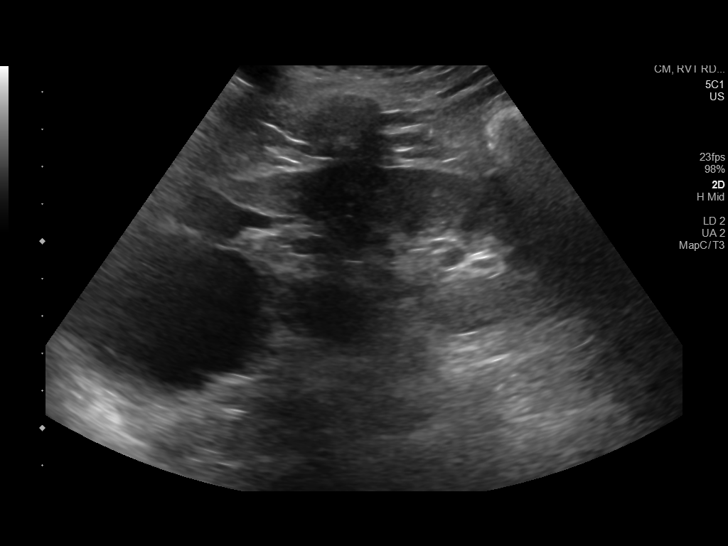
[im 5/54]
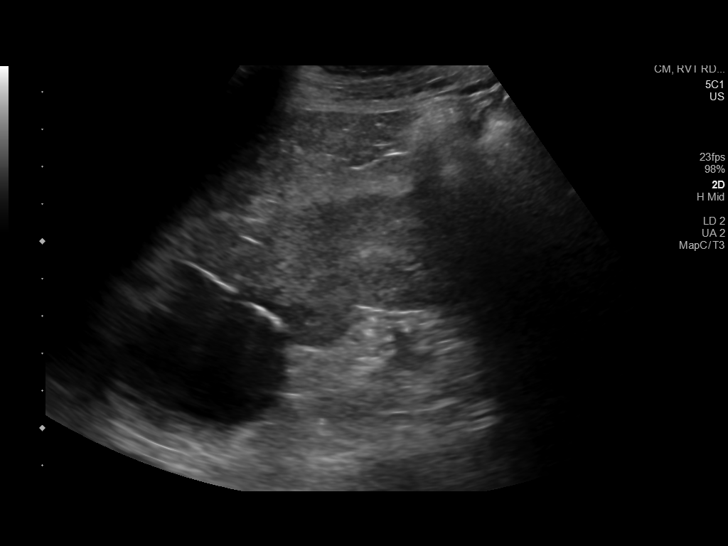
[im 9/54]
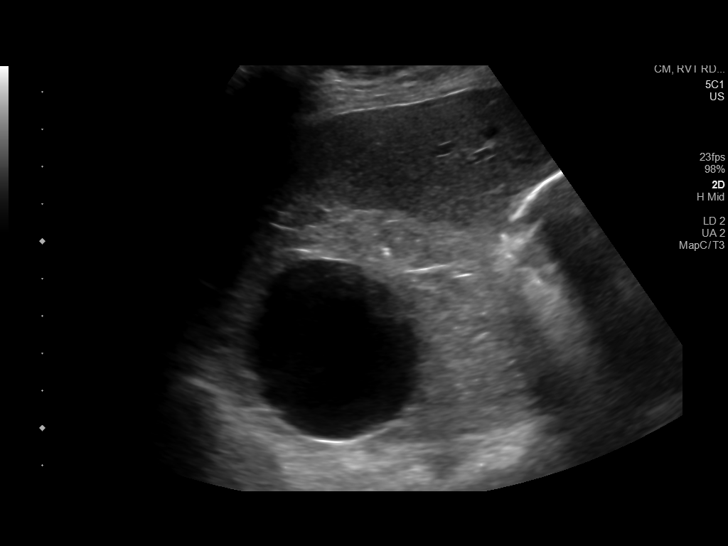
[im 14/54]
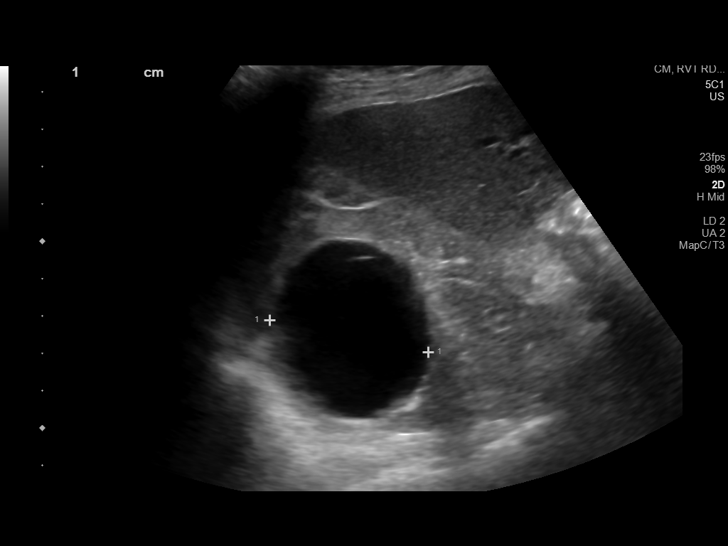
[im 18/54]
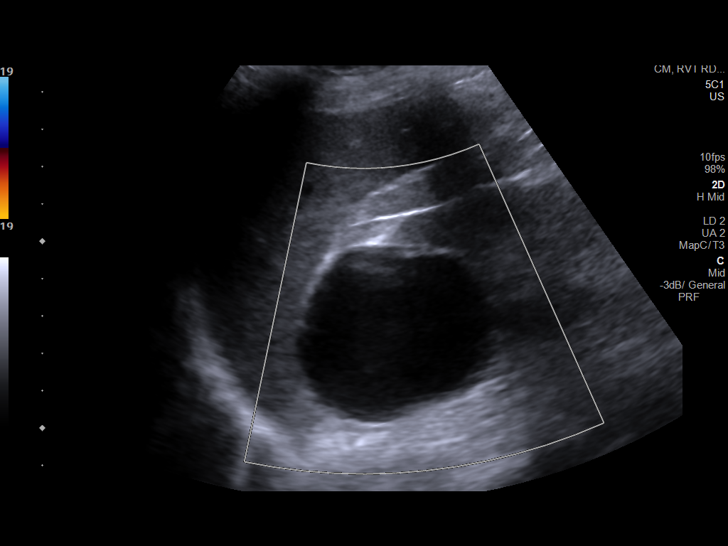
[im 20/54]
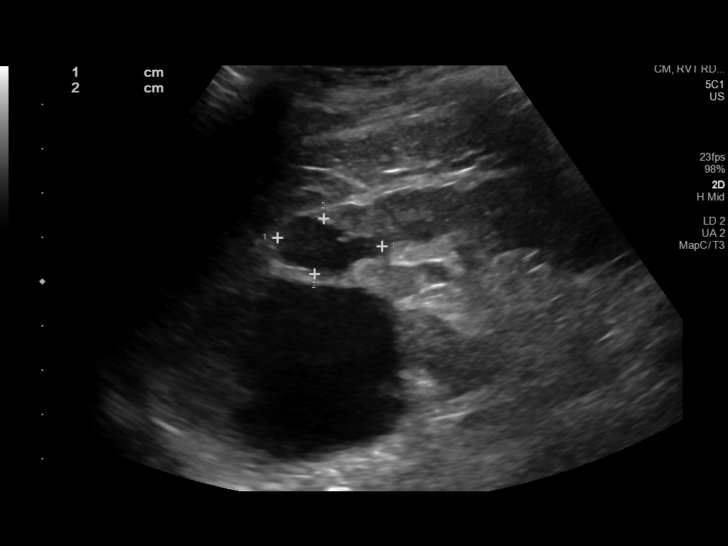
[im 25/54]
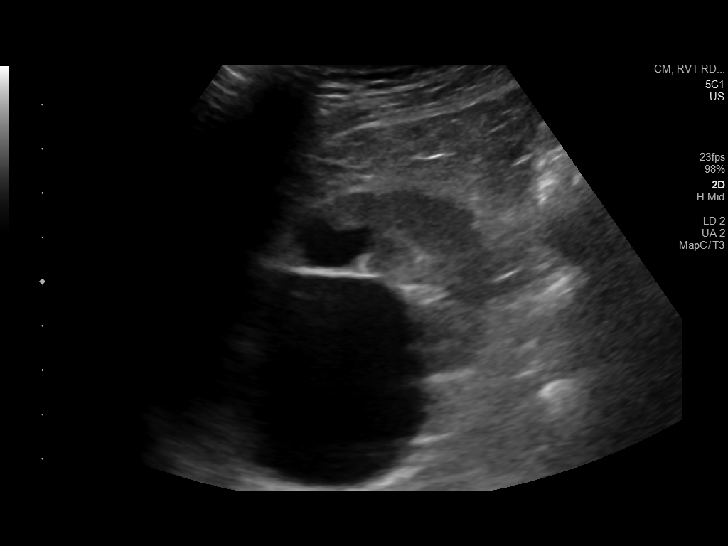
[im 29/54]
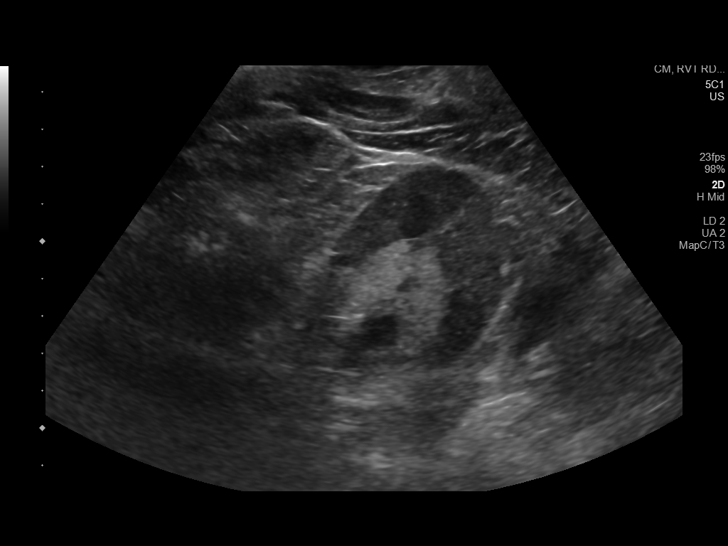
[im 34/54]
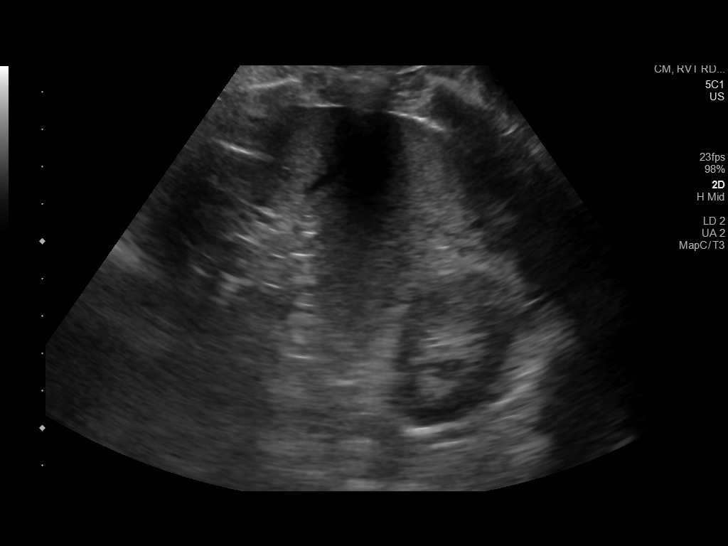
[im 36/54]
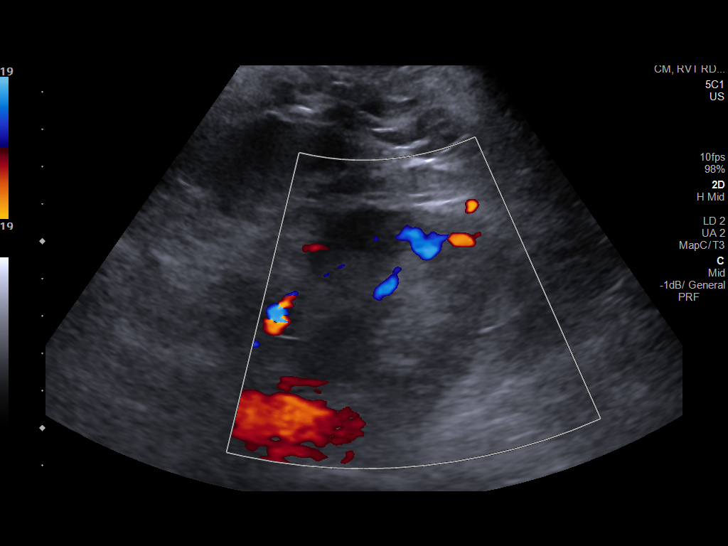
[im 40/54]
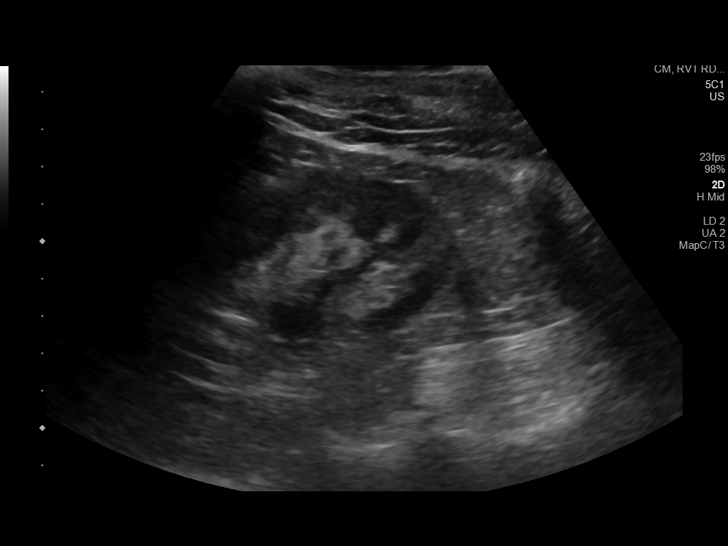
[im 45/54]
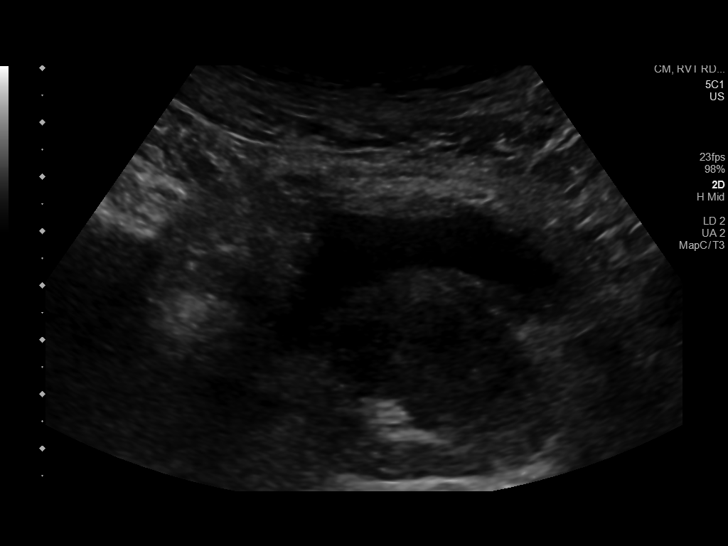
[im 49/54]
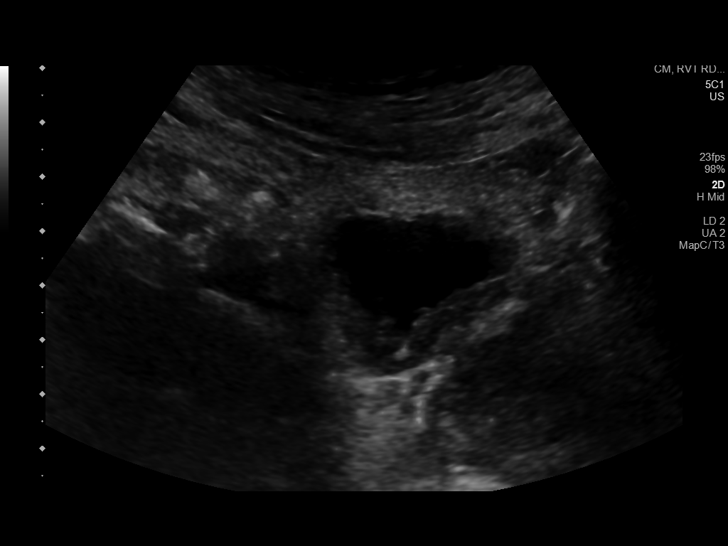
[im 54/54]
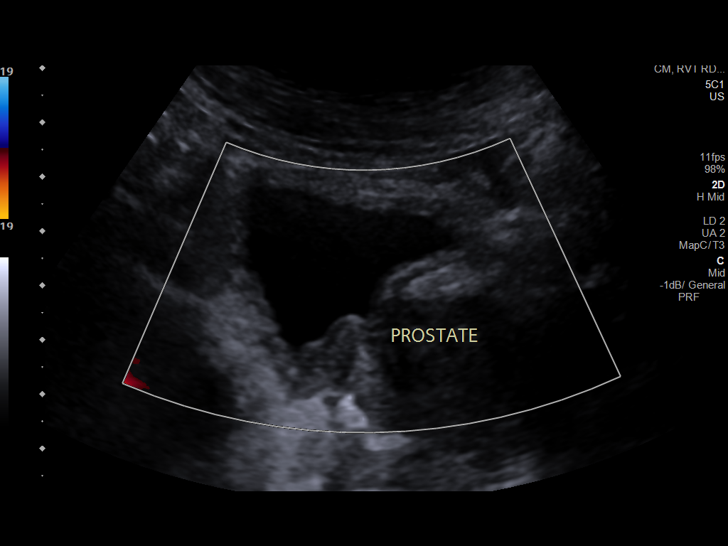

[14 of 25 positions shown; findings below may reference images not displayed]

FINDINGS: Right Kidney:

Renal measurements: 11.2 x 4.8 x 4.6 cm = volume: 128 mL. Two simple
cysts are noted, with the largest measuring 5 cm arising from upper
pole. Echogenicity within normal limits. No mass or hydronephrosis
visualized.

Left Kidney:

Renal measurements: 10.7 x 4.8 x 4.7 cm = volume: 127 mL.
Echogenicity within normal limits. Minimal left hydronephrosis may
be present. No mass visualized.

Bladder:

Appears normal for degree of bladder distention. Enlarged prostate
gland is noted.
IMPRESSION: Minimal left hydronephrosis is noted. Two right simple cysts are
noted. Enlarged prostate gland is noted.

## 2021-05-21 ENCOUNTER — Other Ambulatory Visit: Payer: Self-pay | Admitting: Family Medicine

## 2021-10-12 ENCOUNTER — Ambulatory Visit (INDEPENDENT_AMBULATORY_CARE_PROVIDER_SITE_OTHER): Payer: Medicare Other

## 2021-10-12 VITALS — Ht 70.0 in | Wt 127.0 lb

## 2021-10-12 DIAGNOSIS — M6250 Muscle wasting and atrophy, not elsewhere classified, unspecified site: Secondary | ICD-10-CM | POA: Insufficient documentation

## 2021-10-12 DIAGNOSIS — R630 Anorexia: Secondary | ICD-10-CM | POA: Insufficient documentation

## 2021-10-12 DIAGNOSIS — M1711 Unilateral primary osteoarthritis, right knee: Secondary | ICD-10-CM | POA: Insufficient documentation

## 2021-10-12 DIAGNOSIS — R2681 Unsteadiness on feet: Secondary | ICD-10-CM | POA: Insufficient documentation

## 2021-10-12 DIAGNOSIS — Z0000 Encounter for general adult medical examination without abnormal findings: Secondary | ICD-10-CM | POA: Diagnosis not present

## 2021-10-12 DIAGNOSIS — R7989 Other specified abnormal findings of blood chemistry: Secondary | ICD-10-CM | POA: Insufficient documentation

## 2021-10-12 DIAGNOSIS — E559 Vitamin D deficiency, unspecified: Secondary | ICD-10-CM | POA: Insufficient documentation

## 2021-10-12 DIAGNOSIS — E538 Deficiency of other specified B group vitamins: Secondary | ICD-10-CM | POA: Insufficient documentation

## 2021-10-12 DIAGNOSIS — Z8551 Personal history of malignant neoplasm of bladder: Secondary | ICD-10-CM | POA: Insufficient documentation

## 2021-10-12 NOTE — Patient Instructions (Addendum)
Miguel Hill , Thank you for taking time to come for your Medicare Wellness Visit. I appreciate your ongoing commitment to your health goals. Please review the following plan we discussed and let me know if I can assist you in the future.   Screening recommendations/referrals: Colonoscopy: Cologuard done 10/28/2020 - no repeat required Recommended yearly ophthalmology/optometry visit for glaucoma screening and checkup Recommended yearly dental visit for hygiene and checkup  Vaccinations: Influenza vaccine: Declined - recommended every fall Pneumococcal vaccine: Declined - consider [id number]-34 - once per lifetime Tdap vaccine: Done 2014 - Repeat in 10 years Shingles vaccine: Declined - recommend 2 doses per lifetime 2-6 months apart   Covid-19: Declined  Advanced directives: Please bring a copy of your health care power of attorney and living will to the office to be added to your chart at your convenience.   Conditions/risks identified: Aim for 30 minutes of exercise or brisk walking each day, drink 6-8 glasses of water and eat proteins and vegetables for strength. See the end of this summary for help gaining weight.  Next appointment: Follow up in one year for your annual wellness visit.   Preventive Care 43 Years and Older, Male  Preventive care refers to lifestyle choices and visits with your health care provider that can promote health and wellness. What does preventive care include? A yearly physical exam. This is also called an annual well check. Dental exams once or twice a year. Routine eye exams. Ask your health care provider how often you should have your eyes checked. Personal lifestyle choices, including: Daily care of your teeth and gums. Regular physical activity. Eating a healthy diet. Avoiding tobacco and drug use. Limiting alcohol use. Practicing safe sex. Taking low doses of aspirin every day. Taking vitamin and mineral supplements as recommended by your health care  provider. What happens during an annual well check? The services and screenings done by your health care provider during your annual well check will depend on your age, overall health, lifestyle risk factors, and family history of disease. Counseling  Your health care provider may ask you questions about your: Alcohol use. Tobacco use. Drug use. Emotional well-being. Home and relationship well-being. Sexual activity. Eating habits. History of falls. Memory and ability to understand (cognition). Work and work Statistician. Screening  You may have the following tests or measurements: Height, weight, and BMI. Blood pressure. Lipid and cholesterol levels. These may be checked every 5 years, or more frequently if you are over 27 years old. Skin check. Lung cancer screening. You may have this screening every year starting at age 53 if you have a 30-pack-year history of smoking and currently smoke or have quit within the past 15 years. Fecal occult blood test (FOBT) of the stool. You may have this test every year starting at age 48. Flexible sigmoidoscopy or colonoscopy. You may have a sigmoidoscopy every 5 years or a colonoscopy every 10 years starting at age 72. Prostate cancer screening. Recommendations will vary depending on your family history and other risks. Hepatitis C blood test. Hepatitis B blood test. Sexually transmitted disease (STD) testing. Diabetes screening. This is done by checking your blood sugar (glucose) after you have not eaten for a while (fasting). You may have this done every 1-3 years. Abdominal aortic aneurysm (AAA) screening. You may need this if you are a current or former smoker. Osteoporosis. You may be screened starting at age 37 if you are at high risk. Talk with your health care provider about your test  results, treatment options, and if necessary, the need for more tests. Vaccines  Your health care provider may recommend certain vaccines, such  as: Influenza vaccine. This is recommended every year. Tetanus, diphtheria, and acellular pertussis (Tdap, Td) vaccine. You may need a Td booster every 10 years. Zoster vaccine. You may need this after age 42. Pneumococcal 13-valent conjugate (PCV13) vaccine. One dose is recommended after age 47. Pneumococcal polysaccharide (PPSV23) vaccine. One dose is recommended after age 78. Talk to your health care provider about which screenings and vaccines you need and how often you need them. This information is not intended to replace advice given to you by your health care provider. Make sure you discuss any questions you have with your health care provider. Document Released: 09/02/2015 Document Revised: 04/25/2016 Document Reviewed: 06/07/2015 Elsevier Interactive Patient Education  2017 [place] Prevention in the Home Falls can cause injuries. They can happen to people of all ages. There are many things you can do to make your home safe and to help prevent falls. What can I do on the outside of my home? Regularly fix the edges of walkways and driveways and fix any cracks. Remove anything that might make you trip as you walk through a door, such as a raised step or threshold. Trim any bushes or trees on the path to your home. Use bright outdoor lighting. Clear any walking paths of anything that might make someone trip, such as rocks or tools. Regularly check to see if handrails are loose or broken. Make sure that both sides of any steps have handrails. Any raised decks and porches should have guardrails on the edges. Have any leaves, snow, or ice cleared regularly. Use sand or salt on walking paths during winter. Clean up any spills in your garage right away. This includes oil or grease spills. What can I do in the bathroom? Use night lights. Install grab bars by the toilet and in the tub and shower. Do not use towel bars as grab bars. Use non-skid mats or decals in the tub or  shower. If you need to sit down in the shower, use a plastic, non-slip stool. Keep the floor dry. Clean up any water that spills on the floor as soon as it happens. Remove soap buildup in the tub or shower regularly. Attach bath mats securely with double-sided non-slip rug tape. Do not have throw rugs and other things on the floor that can make you trip. What can I do in the bedroom? Use night lights. Make sure that you have a light by your bed that is easy to reach. Do not use any sheets or blankets that are too big for your bed. They should not hang down onto the floor. Have a firm chair that has side arms. You can use this for support while you get dressed. Do not have throw rugs and other things on the floor that can make you trip. What can I do in the kitchen? Clean up any spills right away. Avoid walking on wet floors. Keep items that you use a lot in easy-to-reach places. If you need to reach something above you, use a strong step stool that has a grab bar. Keep electrical cords out of the way. Do not use floor polish or wax that makes floors slippery. If you must use wax, use non-skid floor wax. Do not have throw rugs and other things on the floor that can make you trip. What can I do with my stairs?  Do not leave any items on the stairs. Make sure that there are handrails on both sides of the stairs and use them. Fix handrails that are broken or loose. Make sure that handrails are as long as the stairways. Check any carpeting to make sure that it is firmly attached to the stairs. Fix any carpet that is loose or worn. Avoid having throw rugs at the top or bottom of the stairs. If you do have throw rugs, attach them to the floor with carpet tape. Make sure that you have a light switch at the top of the stairs and the bottom of the stairs. If you do not have them, ask someone to add them for you. What else can I do to help prevent falls? Wear shoes that: Do not have high heels. Have  rubber bottoms. Are comfortable and fit you well. Are closed at the toe. Do not wear sandals. If you use a stepladder: Make sure that it is fully opened. Do not climb a closed stepladder. Make sure that both sides of the stepladder are locked into place. Ask someone to hold it for you, if possible. Clearly mark and make sure that you can see: Any grab bars or handrails. First and last steps. Where the edge of each step is. Use tools that help you move around (mobility aids) if they are needed. These include: Canes. Walkers. Scooters. Crutches. Turn on the lights when you go into a dark area. Replace any light bulbs as soon as they burn out. Set up your furniture so you have a clear path. Avoid moving your furniture around. If any of your floors are uneven, fix them. If there are any pets around you, be aware of where they are. Review your medicines with your doctor. Some medicines can make you feel dizzy. This can increase your chance of falling. Ask your doctor what other things that you can do to help prevent falls. This information is not intended to replace advice given to you by your health care provider. Make sure you discuss any questions you have with your health care provider. Document Released: 06/02/2009 Document Revised: 01/12/2016 Document Reviewed: 09/10/2014 Elsevier Interactive Patient Education  2017 [place].   High-Protein and High-Calorie Diet Eating high-protein and high-calorie foods can help you to gain weight, heal after an injury, and recover after an illness or surgery. The specific amount of daily protein and calories you need depends on: Your body weight. The reason this diet is recommended for you. Generally, a high-protein, high-calorie diet involves: Eating 250-500 extra calories each day. Making sure that you get enough of your daily calories from protein. Ask your health care provider how many of your calories should come from protein. Talk with  a health care provider or a dietitian about how much protein and how many calories you need each day. Follow the diet as directed by your health care provider. What are tips for following this plan? Reading food labels Check the nutrition facts label for calories, grams of fat and protein. Items with more than 4 grams of protein are high-protein foods. Preparing meals Add whole milk, half-and-half, or heavy cream to cereal, pudding, soup, or hot cocoa. Add whole milk to instant breakfast drinks. Add peanut butter to oatmeal or smoothies. Add powdered milk to baked goods, smoothies, or milkshakes. Add powdered milk, cream, or butter to mashed potatoes. Add cheese to cooked vegetables. Make whole-milk yogurt parfaits. Top them with granola, fruit, or nuts. Add cottage cheese to  fruit. Add avocado, cheese, or both to sandwiches or salads. Add avocado to smoothies. Add meat, poultry, or seafood to rice, pasta, casseroles, salads, and soups. Use mayonnaise when making egg salad, chicken salad, or tuna salad. Use peanut butter as a dip for fruits and vegetables or as a topping for pretzels, celery, or crackers. Add beans to casseroles, dips, and spreads. Add pureed beans to sauces and soups. Replace calorie-free drinks with calorie-containing drinks, such as milk and fruit juice. Replace water with milk or heavy cream when making foods such as oatmeal, pudding, or cocoa. Add oil or butter to cooked vegetables and grains. Add cream cheese to sandwiches or as a topping on crackers and bread. Make cream-based pastas and soups. General information Ask your health care provider if you should take a nutritional supplement. Try to eat six small meals each day instead of three large meals. A general goal is to eat every 2 to 3 hours. Eat a balanced diet. In each meal, include one food that is high in protein and one food with fat in it. Keep nutritious snacks available, such as nuts, trail mixes, dried  fruit, and yogurt. If you have kidney disease or diabetes, talk with your health care provider about how much protein is safe for you. Too much protein may put extra stress on your kidneys. Drink your calories. Choose high-calorie drinks and have them after your meals. Consider setting a timer to remind you to eat. You will want to eat even if you do not feel very hungry. What high-protein foods should I eat? Vegetables Soybeans. Peas. Grains Quinoa. Bulgur wheat. Buckwheat. Meats and other proteins Beef, pork, and poultry. Fish and seafood. Eggs. Tofu. Textured vegetable protein (TVP). Peanut butter. Nuts and seeds. Dried beans. Protein powders. Hummus. Dairy Whole milk. Whole-milk yogurt. Powdered milk. Cheese. [employer]. Eggnog. Beverages High-protein supplement drinks. Soy milk. Other foods Protein bars. The items listed above may not be a complete list of foods and beverages you can eat and drink. Contact a dietitian for more information. What high-calorie foods should I eat? Fruits Dried fruit. Fruit leather. Canned fruit in syrup. Fruit juice. Avocado. Vegetables Vegetables cooked in oil or butter. Fried potatoes. Grains Pasta. Quick breads. Muffins. Pancakes. Ready-to-eat cereal. Meats and other proteins Peanut butter. Nuts and seeds. Dairy Heavy cream. Whipped cream. Cream cheese. Sour cream. Ice cream. Custard. Pudding. Whole milk dairy products. Beverages Meal-replacement beverages. Nutrition shakes. Fruit juice. Seasonings and condiments Salad dressing. Mayonnaise. Alfredo sauce. Fruit preserves or jelly. Honey. Syrup. Sweets and desserts Cake. Cookies. Pie. Pastries. Candy bars. Chocolate. Fats and oils Butter or margarine. Oil. Gravy. Other foods Meal-replacement bars. The items listed above may not be a complete list of foods and beverages you can eat and drink. Contact a dietitian for more information. Summary A high-protein, high-calorie diet can help  you gain weight or heal faster after an injury, illness, or surgery. To increase your protein and calories, add ingredients such as whole milk, peanut butter, cheese, beans, meat, or seafood to meal items. To get enough extra calories each day, include high-calorie foods and beverages at each meal. Adding a high-calorie drink or shake can be an easy way to help you get enough calories each day. Talk with your healthcare provider or dietitian about the best options for you. This information is not intended to replace advice given to you by your health care provider. Make sure you discuss any questions you have with your health care provider. Document Revised:  07/10/2020 Document Reviewed: 07/10/2020 Elsevier Patient Education  2022 [employer].

## 2021-10-12 NOTE — Progress Notes (Signed)
Subjective:   Miguel Hill is a 88 y.o. male who presents for Medicare Annual/Subsequent preventive examination.  Virtual Visit via Telephone Note  I connected with  Miguel Hill on 10/12/21 at 10:30 AM EST by telephone and verified that I am speaking with the correct person using two identifiers.  Location: Patient: Home Provider: [facility] Persons participating in the virtual visit: patient/Nurse Health Advisor   I discussed the limitations, risks, security and privacy concerns of performing an evaluation and management service by telephone and the availability of in person appointments. The patient expressed understanding and agreed to proceed.  Interactive audio and video telecommunications were attempted between this nurse and patient, however failed, due to patient having technical difficulties OR patient did not have access to video capability.  We continued and completed visit with audio only.  Some vital signs may be absent or patient reported.   Jennier Schissler E Brownie Nehme, LPN   Review of Systems     Cardiac Risk Factors include: advanced age (>80men, >60 women);sedentary lifestyle;Other (see comment), Risk factor comments: pre-diabetes, underweight     Objective:    Today's Vitals   10/12/21 1035  Weight: 127 lb (57.6 kg)  Height: 5\' 10"  (1.778 m)  PainSc: 3    Body mass index is 18.22 kg/m.  Advanced Directives 10/12/2021 10/11/2020 10/06/2019 08/05/2018 07/25/2018 07/23/2018  Does Patient Have a Medical Advance Directive? No No Yes No No No  Type of Advance Directive - - Living will - - -  Does patient want to make changes to medical advance directive? - - No - Patient declined - - -  Would patient like information on creating a medical advance directive? No - Patient declined No - Patient declined - - No - Patient declined No - Patient declined    Current Medications (verified) Outpatient Encounter Medications as of 10/12/2021  Medication Sig   ACCU-CHEK AVIVA PLUS test  strip USE AS DIRECTED FOR TESTING SUGAR TWICE DAILY E11.9   cyanocobalamin (,VITAMIN B-12,) 1000 MCG/ML injection Inject 1,000 mcg into the muscle every 30 (thirty) days.   Vitamin D, Ergocalciferol, (DRISDOL) 1.25 MG (50000 UNIT) CAPS capsule 1 capsule   carbidopa-levodopa (SINEMET) 10-100 MG tablet Take 1 tablet by mouth 3 (three) times daily. For parkinsonism (Patient not taking: Reported on 10/12/2021)   thyroid (ARMOUR THYROID) 30 MG tablet Take 1 tablet (30 mg total) by mouth daily before breakfast. (Patient not taking: Reported on 10/12/2021)   [DISCONTINUED] thyroid (ARMOUR THYROID) 30 MG tablet Take 1 tablet (30 mg total) by mouth daily before breakfast.   No facility-administered encounter medications on file as of 10/12/2021.    Allergies (verified) Patient has no known allergies.   History: Past Medical History:  Diagnosis Date   Hypertension    PONV (postoperative nausea and vomiting)    Prediabetes    Thyroid disease    Past Surgical History:  Procedure Laterality Date   HERNIA REPAIR Bilateral    TRANSURETHRAL RESECTION OF BLADDER TUMOR WITH MITOMYCIN-C N/A 07/25/2018   Procedure: CYSTOSCOPY TRANSURETHRAL RESECTION OF BLADDER TUMOR WITH GEMCITABINE INSTILLATION;  Surgeon: Irine Seal, MD;  Location: [facility];  Service: Urology;  Laterality: N/A;   Family History  Problem Relation Age of Onset   Heart attack Father    Social History   Socioeconomic History   Marital status: Divorced    Spouse name: Not on file   Number of children: 0   Years of education: 14   Highest education level: Some college, no degree  Occupational History   Occupation: retired  Tobacco Use   Smoking status: Former   Smokeless tobacco: Current    Types: Chew   Tobacco comments:    rarely uses chewing tobacco  Vaping Use   Vaping Use: Never used  Substance and Sexual Activity   Alcohol use: No   Drug use: No   Sexual activity: Not Currently    Birth control/protection: None  Other  Topics Concern   Not on file  Social History Narrative   Lives alone on one level   Has close friends that help him when needed   Social Determinants of Health   Financial Resource Strain: Low Risk    Difficulty of Paying Living Expenses: Not hard at all  Food Insecurity: No Food Insecurity   Worried About Charity fundraiser in the Last Year: Never true   [place] in the Last Year: Never true  Transportation Needs: No Transportation Needs   Lack of Transportation (Medical): No   Lack of Transportation (Non-Medical): No  Physical Activity: Insufficiently Active   Days of Exercise per Week: 7 days   Minutes of Exercise per Session: 20 min  Stress: No Stress Concern Present   Feeling of Stress : Not at all  Social Connections: Socially Isolated   Frequency of Communication with Friends and Family: Twice a week   Frequency of Social Gatherings with Friends and Family: Three times a week   Attends Religious Services: Never   Active Member of Clubs or Organizations: No   Attends Archivist Meetings: Never   Marital Status: Divorced    Tobacco Counseling Ready to quit: Not Answered Counseling given: Not Answered Tobacco comments: rarely uses chewing tobacco   Clinical Intake:  Pre-visit preparation completed: Yes  Pain : 0-10 Pain Score: 3  Pain Type: Chronic pain Pain Location: Generalized Pain Descriptors / Indicators: Aching, Discomfort Pain Onset: More than a month ago Pain Frequency: Intermittent     BMI - recorded: 18.22 Nutritional Status: BMI <19  Underweight Nutritional Risks: Unintentional weight loss Diabetes: No  How often do you need to have someone help you when you read instructions, pamphlets, or other written materials from your doctor or pharmacy?: 1 - Never  Diabetic? no  Interpreter Needed?: No  Information entered by :: Mertie Haslem, LPN   Activities of Daily Living In your present state of health, do you have any  difficulty performing the following activities: 10/12/2021  Hearing? N  Vision? N  Difficulty concentrating or making decisions? N  Walking or climbing stairs? Y  Dressing or bathing? N  Doing errands, shopping? Y  Comment has friends that drive him  Conservation officer, nature and eating ? N  Using the Toilet? N  In the past six months, have you accidently leaked urine? Y  Comment mild - BPH  Do you have problems with loss of bowel control? N  Managing your Medications? N  Managing your Finances? N  Housekeeping or managing your Housekeeping? Y  Some recent data might be hidden    Patient Care Team: Claretta Fraise, MD as PCP - General (Family Medicine)  Indicate any recent Medical Services you may have received from other than [facility] providers in the past year (date may be approximate).     Assessment:   This is a routine wellness examination for [facility].  Hearing/Vision screen Hearing Screening - Comments:: Denies hearing difficulties   Vision Screening - Comments:: No vision concerns - up to date with  routine eye exams with [facility]  Dietary issues and exercise activities discussed: Current Exercise Habits: Home exercise routine, Type of exercise: walking;stretching, Time (Minutes): 20, Frequency (Times/Week): 7, Weekly Exercise (Minutes/Week): 140, Intensity: Mild, Exercise limited by: neurologic condition(s);orthopedic condition(s)   Goals Addressed             This Visit's Progress    Patient Stated   On track    10/11/2020 AWV Goal: Fall Prevention  Over the next year, patient will decrease their risk for falls by: Using assistive devices, such as a cane or walker, as needed Identifying fall risks within their home and correcting them by: Removing throw rugs Adding handrails to stairs or ramps Removing clutter and keeping a clear pathway throughout the home Increasing light, especially at night Adding shower handles/bars Raising toilet seat Identifying potential  personal risk factors for falls: Medication side effects Incontinence/urgency Vestibular dysfunction Hearing loss Musculoskeletal disorders Neurological disorders Orthostatic hypotension         Depression Screen PHQ 2/9 Scores 10/12/2021 10/11/2020 07/29/2020 05/10/2020 10/06/2019 08/25/2019 02/11/2019  PHQ - 2 Score 0 0 0 0 0 0 0  PHQ- 9 Score - - - - 12 - -    Fall Risk Fall Risk  10/12/2021 10/11/2020 07/29/2020 05/10/2020 10/06/2019  Falls in the past year? 1 0 0 0 0  Number falls in past yr: 0 - - 0 -  Injury with Fall? 0 - - 0 -  Risk for fall due to : Orthopedic patient;Impaired balance/gait - - Impaired balance/gait -  Follow up Falls prevention discussed Falls evaluation completed Falls evaluation completed Falls evaluation completed -    FALL RISK PREVENTION PERTAINING TO THE HOME:  Any stairs in or around the home? Yes  If so, are there any without handrails? No  Home free of loose throw rugs in walkways, pet beds, electrical cords, etc? Yes  Adequate lighting in your home to reduce risk of falls? Yes   ASSISTIVE DEVICES UTILIZED TO PREVENT FALLS:  Life alert? Yes  Use of a cane, walker or w/c? Yes  Grab bars in the bathroom? Yes  Shower chair or bench in shower? Yes  Elevated toilet seat or a handicapped toilet? Yes   TIMED UP AND GO:  Was the test performed? No . Telephonic visit  Cognitive Function: Normal cognitive status assessed by direct observation by this Nurse Health Advisor. No abnormalities found.   MMSE - Mini Mental State Exam 05/28/2017  Orientation to time 5  Orientation to Place 5  Registration 3  Attention/ Calculation 5  Recall 3  Language- name 2 objects 2  Language- repeat 1  Language- follow 3 step command 3  Language- read & follow direction 1  Write a sentence 1  Copy design 0  Total score 29     6CIT Screen 10/12/2021 10/11/2020 10/06/2019  What Year? 0 points 0 points 0 points  What month? 0 points 0 points 0 points  What time?  0 points 0 points 0 points  Count back from 20 0 points 0 points 0 points  Months in reverse 0 points 0 points 0 points  Repeat phrase 0 points 0 points 0 points  Total Score 0 0 0    Immunizations Immunization History  Administered Date(s) Administered   Pneumococcal Polysaccharide-23 08/20/2008   Tdap 08/20/2012   Zoster, Live 08/20/2009    TDAP status: Up to date  Flu Vaccine status: Declined, Education has been provided regarding the importance of this vaccine  but patient still declined. Advised may receive this vaccine at local pharmacy or Health Dept. Aware to provide a copy of the vaccination record if obtained from local pharmacy or Health Dept. Verbalized acceptance and understanding.  Pneumococcal vaccine status: Declined,  Education has been provided regarding the importance of this vaccine but patient still declined. Advised may receive this vaccine at local pharmacy or Health Dept. Aware to provide a copy of the vaccination record if obtained from local pharmacy or Health Dept. Verbalized acceptance and understanding.   Covid-19 vaccine status: Declined, Education has been provided regarding the importance of this vaccine but patient still declined. Advised may receive this vaccine at local pharmacy or Health Dept.or vaccine clinic. Aware to provide a copy of the vaccination record if obtained from local pharmacy or Health Dept. Verbalized acceptance and understanding.  Qualifies for Shingles Vaccine? Yes   Zostavax completed Yes   Shingrix Completed?: No.    Education has been provided regarding the importance of this vaccine. Patient has been advised to call insurance company to determine out of pocket expense if they have not yet received this vaccine. Advised may also receive vaccine at local pharmacy or Health Dept. Verbalized acceptance and understanding.  Screening Tests Health Maintenance  Topic Date Due   COVID-19 Vaccine (1) Never done   Zoster Vaccines- Shingrix  (1 of 2) Never done   Pneumonia Vaccine 38+ Years old (2 - PCV) 08/20/2009   INFLUENZA VACCINE  Never done   TETANUS/TDAP  08/20/2022   HPV VACCINES  Aged Out    Health Maintenance  Health Maintenance Due  Topic Date Due   COVID-19 Vaccine (1) Never done   Zoster Vaccines- Shingrix (1 of 2) Never done   Pneumonia Vaccine 50+ Years old (2 - PCV) 08/20/2009   INFLUENZA VACCINE  Never done    Colorectal cancer screening: Type of screening: Cologuard. Completed 10/28/2020. Repeat every 3 years or not required  Lung Cancer Screening: (Low Dose CT Chest recommended if Age 45-80 years, 30 pack-year currently smoking OR have quit w/in 15years.) does not qualify.  Additional Screening:  Hepatitis C Screening: does not qualify  Vision Screening: Recommended annual ophthalmology exams for early detection of glaucoma and other disorders of the eye. Is the patient up to date with their annual eye exam?  Yes  Who is the provider or what is the name of the office in which the patient attends annual eye exams? [place] If pt is not established with a provider, would they like to be referred to a provider to establish care? No .   Dental Screening: Recommended annual dental exams for proper oral hygiene  Community Resource Referral / Chronic Care Management: CRR required this visit?  No   CCM required this visit?  No      Plan:     I have personally reviewed and noted the following in the patients chart:   Medical and social history Use of alcohol, tobacco or illicit drugs  Current medications and supplements including opioid prescriptions. Patient is not currently taking opioid prescriptions. Functional ability and status Nutritional status Physical activity Advanced directives List of other physicians Hospitalizations, surgeries, and ER visits in previous 12 months Vitals Screenings to include cognitive, depression, and falls Referrals and appointments  In addition, I  have reviewed and discussed with patient certain preventive protocols, quality metrics, and best practice recommendations. A written personalized care plan for preventive services as well as general preventive health recommendations were provided to patient.  Sandrea Hammond, LPN   [id number]   Nurse Notes: None

## 2022-01-05 ENCOUNTER — Other Ambulatory Visit: Payer: Self-pay | Admitting: Family Medicine

## 2022-01-12 ENCOUNTER — Other Ambulatory Visit: Payer: Self-pay | Admitting: Family Medicine

## 2022-02-28 ENCOUNTER — Encounter (HOSPITAL_COMMUNITY): Payer: Medicare Other

## 2022-03-26 ENCOUNTER — Other Ambulatory Visit: Payer: Self-pay | Admitting: Student

## 2022-03-26 ENCOUNTER — Other Ambulatory Visit (HOSPITAL_BASED_OUTPATIENT_CLINIC_OR_DEPARTMENT_OTHER): Payer: Self-pay | Admitting: Student

## 2022-03-26 DIAGNOSIS — I10 Essential (primary) hypertension: Secondary | ICD-10-CM

## 2022-04-19 ENCOUNTER — Other Ambulatory Visit: Payer: Self-pay | Admitting: Family Medicine

## 2022-06-18 ENCOUNTER — Other Ambulatory Visit: Payer: Self-pay | Admitting: Family Medicine

## 2022-06-21 ENCOUNTER — Other Ambulatory Visit: Payer: Self-pay | Admitting: Family Medicine

## 2022-10-18 ENCOUNTER — Other Ambulatory Visit: Payer: Self-pay | Admitting: Family Medicine

## 2022-10-30 ENCOUNTER — Telehealth: Payer: Self-pay | Admitting: Family Medicine

## 2022-10-30 NOTE — Telephone Encounter (Signed)
Contacted Miguel Hill to schedule their annual wellness visit. Patient declined to schedule AWV at this time. Due to no longer a patient -   per chart he sees pcp at [email or web address]   Thank you,  Colletta [place],  [place] ??[phone]

## 2023-01-08 ENCOUNTER — Other Ambulatory Visit: Payer: Self-pay | Admitting: Family Medicine

## 2023-03-21 ENCOUNTER — Other Ambulatory Visit: Payer: Self-pay | Admitting: Family Medicine
